# Patient Record
Sex: Female | Born: 1950
Health system: Southern US, Community
[De-identification: ages and names within clinical notes are randomized; demographics above are authoritative.]

## PROBLEM LIST (undated history)

## (undated) DIAGNOSIS — I639 Cerebral infarction, unspecified: Secondary | ICD-10-CM

## (undated) DIAGNOSIS — E559 Vitamin D deficiency, unspecified: Secondary | ICD-10-CM

## (undated) DIAGNOSIS — D649 Anemia, unspecified: Secondary | ICD-10-CM

## (undated) DIAGNOSIS — I4891 Unspecified atrial fibrillation: Secondary | ICD-10-CM

## (undated) DIAGNOSIS — M1990 Unspecified osteoarthritis, unspecified site: Secondary | ICD-10-CM

## (undated) DIAGNOSIS — H269 Unspecified cataract: Secondary | ICD-10-CM

## (undated) DIAGNOSIS — E785 Hyperlipidemia, unspecified: Secondary | ICD-10-CM

## (undated) DIAGNOSIS — T7840XA Allergy, unspecified, initial encounter: Secondary | ICD-10-CM

## (undated) HISTORY — DX: Cerebral infarction, unspecified: I63.9

## (undated) HISTORY — DX: Hyperlipidemia, unspecified: E78.5

## (undated) HISTORY — DX: Allergy, unspecified, initial encounter: T78.40XA

## (undated) HISTORY — DX: Unspecified osteoarthritis, unspecified site: M19.90

## (undated) HISTORY — PX: CHOLECYSTECTOMY: SHX55

## (undated) HISTORY — PX: ABDOMINAL HYSTERECTOMY: SHX81

## (undated) HISTORY — DX: Anemia, unspecified: D64.9

## (undated) HISTORY — DX: Unspecified cataract: H26.9

## (undated) HISTORY — PX: HERNIA REPAIR: SHX51

## (undated) HISTORY — DX: Unspecified atrial fibrillation: I48.91

---

## 1985-01-11 HISTORY — PX: GASTROPLASTY: SHX192

## 1985-01-11 HISTORY — PX: CHOLECYSTECTOMY: SHX55

## 2003-10-31 ENCOUNTER — Ambulatory Visit: Payer: Self-pay

## 2004-11-25 ENCOUNTER — Ambulatory Visit: Payer: Self-pay | Admitting: Family Medicine

## 2005-07-20 DIAGNOSIS — E7800 Pure hypercholesterolemia, unspecified: Secondary | ICD-10-CM | POA: Insufficient documentation

## 2005-12-01 ENCOUNTER — Ambulatory Visit: Payer: Self-pay | Admitting: Family Medicine

## 2006-02-08 DIAGNOSIS — D2360 Other benign neoplasm of skin of unspecified upper limb, including shoulder: Secondary | ICD-10-CM

## 2006-02-08 DIAGNOSIS — Z0000 Encounter for general adult medical examination without abnormal findings: Secondary | ICD-10-CM | POA: Insufficient documentation

## 2006-02-08 DIAGNOSIS — G43109 Migraine with aura, not intractable, without status migrainosus: Secondary | ICD-10-CM

## 2006-02-15 DIAGNOSIS — E785 Hyperlipidemia, unspecified: Secondary | ICD-10-CM

## 2006-04-15 ENCOUNTER — Ambulatory Visit: Payer: Self-pay | Admitting: General Surgery

## 2006-10-31 ENCOUNTER — Ambulatory Visit: Payer: Self-pay | Admitting: Family Medicine

## 2006-10-31 DIAGNOSIS — S63509A Unspecified sprain of unspecified wrist, initial encounter: Secondary | ICD-10-CM

## 2006-10-31 DIAGNOSIS — S8000XA Contusion of unspecified knee, initial encounter: Secondary | ICD-10-CM | POA: Insufficient documentation

## 2006-11-02 ENCOUNTER — Ambulatory Visit: Payer: Self-pay | Admitting: Family Medicine

## 2006-11-02 DIAGNOSIS — J069 Acute upper respiratory infection, unspecified: Secondary | ICD-10-CM | POA: Insufficient documentation

## 2007-02-09 ENCOUNTER — Ambulatory Visit: Payer: Self-pay | Admitting: Family Medicine

## 2007-02-15 ENCOUNTER — Ambulatory Visit: Payer: Self-pay | Admitting: Family Medicine

## 2007-03-09 DIAGNOSIS — L259 Unspecified contact dermatitis, unspecified cause: Secondary | ICD-10-CM

## 2007-03-09 DIAGNOSIS — R030 Elevated blood-pressure reading, without diagnosis of hypertension: Secondary | ICD-10-CM | POA: Insufficient documentation

## 2007-08-23 DIAGNOSIS — R109 Unspecified abdominal pain: Secondary | ICD-10-CM | POA: Insufficient documentation

## 2007-08-25 ENCOUNTER — Encounter: Payer: Self-pay | Admitting: Gastroenterology

## 2007-08-25 ENCOUNTER — Inpatient Hospital Stay: Payer: Self-pay | Admitting: Internal Medicine

## 2007-08-26 ENCOUNTER — Encounter: Payer: Self-pay | Admitting: Gastroenterology

## 2007-08-30 ENCOUNTER — Encounter: Payer: Self-pay | Admitting: Gastroenterology

## 2007-08-30 DIAGNOSIS — D379 Neoplasm of uncertain behavior of digestive organ, unspecified: Secondary | ICD-10-CM | POA: Insufficient documentation

## 2007-09-21 ENCOUNTER — Ambulatory Visit (HOSPITAL_COMMUNITY): Admission: RE | Admit: 2007-09-21 | Discharge: 2007-09-21 | Payer: Self-pay | Admitting: Gastroenterology

## 2007-09-21 ENCOUNTER — Encounter: Payer: Self-pay | Admitting: Gastroenterology

## 2007-09-21 ENCOUNTER — Ambulatory Visit: Payer: Self-pay | Admitting: Gastroenterology

## 2007-09-25 ENCOUNTER — Encounter: Payer: Self-pay | Admitting: Gastroenterology

## 2007-09-26 ENCOUNTER — Telehealth: Payer: Self-pay | Admitting: Gastroenterology

## 2008-01-23 DIAGNOSIS — K859 Acute pancreatitis without necrosis or infection, unspecified: Secondary | ICD-10-CM | POA: Insufficient documentation

## 2008-02-14 ENCOUNTER — Ambulatory Visit: Payer: Self-pay

## 2008-11-08 DIAGNOSIS — E668 Other obesity: Secondary | ICD-10-CM

## 2009-01-20 DIAGNOSIS — D649 Anemia, unspecified: Secondary | ICD-10-CM

## 2009-02-10 DIAGNOSIS — Z1239 Encounter for other screening for malignant neoplasm of breast: Secondary | ICD-10-CM | POA: Insufficient documentation

## 2009-02-10 DIAGNOSIS — Z803 Family history of malignant neoplasm of breast: Secondary | ICD-10-CM | POA: Insufficient documentation

## 2009-02-18 ENCOUNTER — Ambulatory Visit: Payer: Self-pay | Admitting: Family Medicine

## 2010-02-20 ENCOUNTER — Ambulatory Visit: Payer: Self-pay | Admitting: Family Medicine

## 2011-03-31 ENCOUNTER — Ambulatory Visit: Payer: Self-pay | Admitting: Family Medicine

## 2012-03-31 ENCOUNTER — Ambulatory Visit: Payer: Self-pay | Admitting: Family Medicine

## 2012-11-02 ENCOUNTER — Ambulatory Visit: Payer: Self-pay | Admitting: Family Medicine

## 2012-11-06 ENCOUNTER — Ambulatory Visit: Payer: Self-pay | Admitting: Family Medicine

## 2013-04-03 ENCOUNTER — Ambulatory Visit: Payer: Self-pay | Admitting: Family Medicine

## 2013-08-15 LAB — HEPATIC FUNCTION PANEL
ALT: 13 U/L (ref 7–35)
AST: 19 U/L (ref 13–35)

## 2014-03-18 LAB — CBC AND DIFFERENTIAL
HEMATOCRIT: 40 % (ref 36–46)
NEUTROS ABS: 53 /uL
WBC: 4.5 10^3/mL

## 2014-04-11 ENCOUNTER — Ambulatory Visit
Admit: 2014-04-11 | Disposition: A | Discharge status: Home / Self Care | Payer: Self-pay | Attending: Family Medicine | Admitting: Family Medicine

## 2014-04-13 ENCOUNTER — Other Ambulatory Visit: Payer: Self-pay

## 2014-04-13 DIAGNOSIS — K1230 Oral mucositis (ulcerative), unspecified: Secondary | ICD-10-CM | POA: Insufficient documentation

## 2014-04-13 DIAGNOSIS — IMO0001 Reserved for inherently not codable concepts without codable children: Secondary | ICD-10-CM | POA: Insufficient documentation

## 2014-04-13 DIAGNOSIS — A630 Anogenital (venereal) warts: Secondary | ICD-10-CM | POA: Insufficient documentation

## 2014-04-13 DIAGNOSIS — J209 Acute bronchitis, unspecified: Secondary | ICD-10-CM | POA: Insufficient documentation

## 2014-04-13 DIAGNOSIS — F411 Generalized anxiety disorder: Secondary | ICD-10-CM | POA: Insufficient documentation

## 2014-04-13 DIAGNOSIS — Z1331 Encounter for screening for depression: Secondary | ICD-10-CM | POA: Insufficient documentation

## 2014-04-13 DIAGNOSIS — H6120 Impacted cerumen, unspecified ear: Secondary | ICD-10-CM | POA: Insufficient documentation

## 2014-04-13 DIAGNOSIS — J40 Bronchitis, not specified as acute or chronic: Secondary | ICD-10-CM | POA: Insufficient documentation

## 2014-04-13 DIAGNOSIS — B019 Varicella without complication: Secondary | ICD-10-CM | POA: Insufficient documentation

## 2014-04-13 DIAGNOSIS — Z9229 Personal history of other drug therapy: Secondary | ICD-10-CM | POA: Insufficient documentation

## 2014-04-13 DIAGNOSIS — Z9884 Bariatric surgery status: Secondary | ICD-10-CM | POA: Insufficient documentation

## 2014-04-13 DIAGNOSIS — Z8673 Personal history of transient ischemic attack (TIA), and cerebral infarction without residual deficits: Secondary | ICD-10-CM | POA: Insufficient documentation

## 2014-04-13 DIAGNOSIS — M25539 Pain in unspecified wrist: Secondary | ICD-10-CM | POA: Insufficient documentation

## 2014-04-13 DIAGNOSIS — Z23 Encounter for immunization: Secondary | ICD-10-CM | POA: Insufficient documentation

## 2014-04-13 DIAGNOSIS — E559 Vitamin D deficiency, unspecified: Secondary | ICD-10-CM | POA: Insufficient documentation

## 2014-04-13 DIAGNOSIS — Z13820 Encounter for screening for osteoporosis: Secondary | ICD-10-CM | POA: Insufficient documentation

## 2014-06-17 ENCOUNTER — Ambulatory Visit (INDEPENDENT_AMBULATORY_CARE_PROVIDER_SITE_OTHER): Payer: PRIVATE HEALTH INSURANCE | Admitting: Family Medicine

## 2014-06-17 ENCOUNTER — Encounter: Payer: Self-pay | Admitting: Family Medicine

## 2014-06-17 VITALS — BP 124/84 | HR 62 | Temp 97.7°F | Resp 16 | Wt 290.8 lb

## 2014-06-17 DIAGNOSIS — E785 Hyperlipidemia, unspecified: Secondary | ICD-10-CM | POA: Diagnosis not present

## 2014-06-17 NOTE — Progress Notes (Signed)
Subjective:    Patient ID: Karen [name], female    DOB: [birth year], 64 y.o.   MRN: [id number]  HPI Chief Complaint  Patient presents with  . Follow-up    3 month follow up  . Hyperlipidemia - exercise by walking a mile every day. Still on low fat diet in small quantities due to past gastric bypass surgery (three meals a day). Still on Crestor 10 mg qd and no muscle pains.   Family History  Problem Relation Age of Onset  . Hypertension Mother   . Cancer Mother   . Arthritis Mother   . Depression Mother   . Heart disease Father   . Cancer Sister   . Cancer Brother   . Heart disease Maternal Grandmother   . Stroke Maternal Grandmother   . Cancer Paternal Grandfather   . Hyperlipidemia Brother    Past Medical History  Diagnosis Date  . Anemia   . Hyperlipidemia    History  Substance Use Topics  . Smoking status: Never Smoker   . Smokeless tobacco: Not on file  . Alcohol Use: 0.6 oz/week    1 Standard drinks or equivalent per week     Comment: rarely   Past Surgical History  Procedure Laterality Date  . Abdominal hysterectomy    . Hernia repair    . Cholecystectomy    . Cholecystectomy  1987  . Gastroplasty  1987   Allergies  Allergen Reactions  . Tetanus Toxoid     Multiple joint pains.     Current Outpatient Prescriptions on File Prior to Visit  Medication Sig Dispense Refill  . b complex vitamins capsule Take 2 tablets by mouth daily.    . cholecalciferol (VITAMIN D) 1000 UNITS tablet Take 1,000 Units by mouth every other day. 2000 units on all other days    . Cholecalciferol (VITAMIN D3) 2000 UNITS capsule Take 2,000 Units by mouth daily.    . MULTIPLE VITAMIN PO Take by mouth daily.    . rosuvastatin (CRESTOR) 10 MG tablet Take 1 tablet by mouth daily.    . SUMAtriptan (IMITREX) 50 MG tablet Take 1 tablet by mouth as needed.    . timolol (TIMOPTIC) 0.5 % ophthalmic solution Apply 1 drop to eye 2 (two) times daily.     No current facility-administered  medications on file prior to visit.     Review of Systems  Constitutional:       Exercising more in the warm weather. Obesity with very little change - lost 3 lbs since last OV  HENT: Negative.   Eyes: Negative.   Respiratory: Negative.   Cardiovascular: Negative.   Gastrointestinal: Negative.        Objective:   Physical Exam  Constitutional: She appears well-developed.  Obesity - morbid - s/p gastric bypass surgery  HENT:  Head: Normocephalic.  Right Ear: External ear normal.  Left Ear: External ear normal.  Eyes: EOM are normal. Pupils are equal, round, and reactive to light.  Neck: Normal range of motion. Neck supple.  Cardiovascular: Normal rate, regular rhythm and normal heart sounds.   Pulmonary/Chest: Breath sounds normal.  Abdominal: Soft. Bowel sounds are normal.   BP 124/84 mmHg  Pulse 62  Temp(Src) 97.7 F (36.5 C) (Oral)  Resp 16  Wt 290 lb 12.8 oz (131.906 kg)         Assessment & Plan:  1. Hyperlipidemia Stable and tolerating Crestor 10 mg qd without side effects. Lipids in March 2016 showed total  cholesterol 170, triglycerides 183, HDL 47 and LDL 86. Still need to lose significant weight. Continue present dosage of Crestor and recheck blood levels. Follow up pending reports.

## 2014-09-19 ENCOUNTER — Ambulatory Visit: Payer: PRIVATE HEALTH INSURANCE | Admitting: Family Medicine

## 2014-10-15 ENCOUNTER — Telehealth: Payer: Self-pay | Admitting: Family Medicine

## 2014-10-15 DIAGNOSIS — E782 Mixed hyperlipidemia: Secondary | ICD-10-CM

## 2014-10-15 NOTE — Telephone Encounter (Signed)
Patient states she will pick up lab slip on Thursday. Lab slip printed and put at front desk for pick up.

## 2014-10-15 NOTE — Telephone Encounter (Signed)
Pt called to get her lab order to have labs done .  She has an appt next week to see you.  Call back is [phone]  Thanks teri

## 2014-10-22 ENCOUNTER — Other Ambulatory Visit: Payer: Self-pay | Admitting: Family Medicine

## 2014-10-23 LAB — CBC WITH DIFFERENTIAL/PLATELET
BASOS ABS: 0 10*3/uL (ref 0.0–0.2)
Basos: 0 %
EOS (ABSOLUTE): 0.3 10*3/uL (ref 0.0–0.4)
EOS: 6 %
HEMATOCRIT: 38.8 % (ref 34.0–46.6)
HEMOGLOBIN: 12.3 g/dL (ref 11.1–15.9)
Immature Grans (Abs): 0 10*3/uL (ref 0.0–0.1)
Immature Granulocytes: 0 %
LYMPHS ABS: 1.7 10*3/uL (ref 0.7–3.1)
Lymphs: 38 %
MCH: 31.7 pg (ref 26.6–33.0)
MCHC: 31.7 g/dL (ref 31.5–35.7)
MCV: 100 fL — ABNORMAL HIGH (ref 79–97)
MONOCYTES: 11 %
Monocytes Absolute: 0.5 10*3/uL (ref 0.1–0.9)
NEUTROS ABS: 2 10*3/uL (ref 1.4–7.0)
Neutrophils: 45 %
Platelets: 230 10*3/uL (ref 150–379)
RBC: 3.88 x10E6/uL (ref 3.77–5.28)
RDW: 12.9 % (ref 12.3–15.4)
WBC: 4.5 10*3/uL (ref 3.4–10.8)

## 2014-10-23 LAB — COMPREHENSIVE METABOLIC PANEL
ALT: 12 IU/L (ref 0–32)
AST: 20 IU/L (ref 0–40)
Albumin/Globulin Ratio: 1.3 (ref 1.1–2.5)
Albumin: 3.6 g/dL (ref 3.6–4.8)
Alkaline Phosphatase: 54 IU/L (ref 39–117)
BUN/Creatinine Ratio: 18 (ref 11–26)
BUN: 16 mg/dL (ref 8–27)
Bilirubin Total: 0.7 mg/dL (ref 0.0–1.2)
CALCIUM: 9.4 mg/dL (ref 8.7–10.3)
CO2: 25 mmol/L (ref 18–29)
CREATININE: 0.87 mg/dL (ref 0.57–1.00)
Chloride: 102 mmol/L (ref 97–108)
GFR, EST AFRICAN AMERICAN: 83 mL/min/{1.73_m2} (ref >59)
GFR, EST NON AFRICAN AMERICAN: 72 mL/min/{1.73_m2} (ref >59)
Globulin, Total: 2.7 g/dL (ref 1.5–4.5)
Glucose: 98 mg/dL (ref 65–99)
Potassium: 4.7 mmol/L (ref 3.5–5.2)
Sodium: 141 mmol/L (ref 134–144)
TOTAL PROTEIN: 6.3 g/dL (ref 6.0–8.5)

## 2014-10-23 LAB — LIPID PANEL
CHOL/HDL RATIO: 4 ratio (ref 0.0–4.4)
Cholesterol, Total: 166 mg/dL (ref 100–199)
HDL: 41 mg/dL (ref >39)
LDL CALC: 90 mg/dL (ref 0–99)
Triglycerides: 174 mg/dL — ABNORMAL HIGH (ref 0–149)
VLDL Cholesterol Cal: 35 mg/dL (ref 5–40)

## 2014-10-24 ENCOUNTER — Ambulatory Visit (INDEPENDENT_AMBULATORY_CARE_PROVIDER_SITE_OTHER): Payer: PRIVATE HEALTH INSURANCE | Admitting: Family Medicine

## 2014-10-24 ENCOUNTER — Encounter: Payer: Self-pay | Admitting: Family Medicine

## 2014-10-24 VITALS — BP 148/82 | HR 62 | Temp 98.1°F | Resp 16 | Wt 292.8 lb

## 2014-10-24 DIAGNOSIS — E785 Hyperlipidemia, unspecified: Secondary | ICD-10-CM | POA: Diagnosis not present

## 2014-10-24 DIAGNOSIS — Z23 Encounter for immunization: Secondary | ICD-10-CM | POA: Diagnosis not present

## 2014-10-24 DIAGNOSIS — J309 Allergic rhinitis, unspecified: Secondary | ICD-10-CM | POA: Diagnosis not present

## 2014-10-24 NOTE — Progress Notes (Signed)
Patient: Karen [name] Female    DOB: 30-May-1950   64 y.o.   MRN: [id number] Visit Date: 10/24/2014  Today's Provider: Vernie Murders, PA   Chief Complaint  Patient presents with  . Hyperlipidemia   Subjective:    Hyperlipidemia Chronicity: following up on Hyperlipidemia. The current episode started more than 1 year ago. Pertinent negatives include no chest pain. Current antihyperlipidemic treatment includes exercise and diet change (Low fat diet.). Improvement on treatment: on Crestor.  Labs obtained 10/22/14. Past Medical History  Diagnosis Date  . Anemia   . Hyperlipidemia    Past Surgical History  Procedure Laterality Date  . Abdominal hysterectomy    . Hernia repair    . Cholecystectomy    . Cholecystectomy  1987  . Gastroplasty  1987   Family History  Problem Relation Age of Onset  . Hypertension Mother   . Cancer Mother   . Arthritis Mother   . Depression Mother   . Heart disease Father   . Cancer Sister   . Cancer Brother   . Heart disease Maternal Grandmother   . Stroke Maternal Grandmother   . Cancer Paternal Grandfather   . Hyperlipidemia Brother    Lab Results  Component Value Date   WBC 4.5 10/22/2014   HCT 38.8 10/22/2014   GLUCOSE 98 10/22/2014   CHOL 166 10/22/2014   TRIG 174* 10/22/2014   HDL 41 10/22/2014   LDLCALC 90 10/22/2014   ALT 12 10/22/2014   AST 20 10/22/2014   NA 141 10/22/2014   K 4.7 10/22/2014   CL 102 10/22/2014   CREATININE 0.87 10/22/2014   BUN 16 10/22/2014   CO2 25 10/22/2014   Allergies  Allergen Reactions  . Tetanus Toxoid     Multiple joint pains.   Previous Medications   B COMPLEX VITAMINS CAPSULE    Take 2 tablets by mouth daily.   CALCIUM CARBONATE (CALCIUM 600 PO)    Take by mouth daily.   CHOLECALCIFEROL (VITAMIN D) 1000 UNITS TABLET    Take 1,000 Units by mouth every other day. 2000 units on all other days   MULTIPLE VITAMIN PO    Take by mouth daily.   ROSUVASTATIN (CRESTOR) 10 MG TABLET     Take 1 tablet by mouth daily.   SUMATRIPTAN (IMITREX) 50 MG TABLET    Take 1 tablet by mouth as needed.   TIMOLOL (TIMOPTIC) 0.5 % OPHTHALMIC SOLUTION    Apply 1 drop to eye 2 (two) times daily.   Review of Systems  HENT: Positive for rhinorrhea and sneezing. Negative for sinus pressure.   Respiratory: Negative for cough, chest tightness and wheezing.   Cardiovascular: Negative for chest pain, palpitations and leg swelling.  Neurological: Positive for headaches. Negative for dizziness.   Social History  Substance Use Topics  . Smoking status: Never Smoker   . Smokeless tobacco: Not on file  . Alcohol Use: 0.6 oz/week    1 Standard drinks or equivalent per week     Comment: rarely   Objective:   BP 148/82 mmHg  Pulse 62  Temp(Src) 98.1 F (36.7 C) (Oral)  Resp 16  Wt 292 lb 12.8 oz (132.813 kg)  Physical Exam  Constitutional: She is oriented to person, place, and time. She appears well-developed and well-nourished.  Morbid obesity.  HENT:  Head: Normocephalic and atraumatic.  Right Ear: External ear normal.  Left Ear: External ear normal.  Nose: Nose normal.  Eyes: Conjunctivae and  EOM are normal.  Neck: Neck supple.  Cardiovascular: Normal rate, regular rhythm and normal heart sounds.   Pulmonary/Chest: Effort normal and breath sounds normal.  Abdominal: Soft. Bowel sounds are normal.  Musculoskeletal: Normal range of motion.  Neurological: She is alert and oriented to person, place, and time.  Psychiatric: She has a normal mood and affect. Her behavior is normal.      Assessment & Plan:     1. HLD (hyperlipidemia) Tolerating Crestor without side effects. Recent labs show good control except triglycerides still elevated. Continue present dosage and must work on weight loss again. Recheck progress in 3-4 months.  2. Need for influenza vaccination - Flu Vaccine QUAD 36+ mos IM  3. Allergic rhinitis, unspecified allergic rhinitis type Suspect seasonal allergies  (ragweed). May use Claritin or Allegra prn.       Vernie Murders, PA   [facility]

## 2015-02-05 ENCOUNTER — Other Ambulatory Visit: Payer: Self-pay | Admitting: Family Medicine

## 2015-02-05 DIAGNOSIS — E785 Hyperlipidemia, unspecified: Secondary | ICD-10-CM

## 2015-02-25 ENCOUNTER — Ambulatory Visit (INDEPENDENT_AMBULATORY_CARE_PROVIDER_SITE_OTHER): Payer: PRIVATE HEALTH INSURANCE | Admitting: Family Medicine

## 2015-02-25 ENCOUNTER — Encounter: Payer: Self-pay | Admitting: Family Medicine

## 2015-02-25 VITALS — BP 142/98 | HR 74 | Temp 98.2°F | Resp 16 | Wt 296.2 lb

## 2015-02-25 DIAGNOSIS — B354 Tinea corporis: Secondary | ICD-10-CM | POA: Diagnosis not present

## 2015-02-25 MED ORDER — ECONAZOLE NITRATE 1 % EX CREA: TOPICAL_CREAM | Freq: Two times a day (BID) | CUTANEOUS | Status: DC

## 2015-02-25 NOTE — Patient Instructions (Signed)

## 2015-02-25 NOTE — Progress Notes (Signed)
Patient ID: ADALIZ LARAIA, female   DOB: 1950-09-27, 65 y.o.   MRN: [id number]   Patient: Karen [name] Female    DOB: 11-04-1950   65 y.o.   MRN: [id number] Visit Date: 02/25/2015  Today's Provider: Vernie Murders, PA   Chief Complaint  Patient presents with  . Skin Problem   Subjective:    HPI Patient presents with five round shaped circles on right thigh area X 1 month. Patient denies itching and pain. Patient reports when she is driving and pressing the pedal she has a "different" sensation in the right thigh.  Past Medical History  Diagnosis Date  . Anemia   . Hyperlipidemia    Past Surgical History  Procedure Laterality Date  . Abdominal hysterectomy    . Hernia repair    . Cholecystectomy    . Cholecystectomy  1987  . Gastroplasty  1987   Family History  Problem Relation Age of Onset  . Hypertension Mother   . Cancer Mother   . Arthritis Mother   . Depression Mother   . Heart disease Father   . Cancer Sister   . Cancer Brother   . Heart disease Maternal Grandmother   . Stroke Maternal Grandmother   . Cancer Paternal Grandfather   . Hyperlipidemia Brother      Previous Medications   B COMPLEX VITAMINS CAPSULE    Take 2 tablets by mouth daily.   CALCIUM CARBONATE (CALCIUM 600 PO)    Take by mouth daily.   CHOLECALCIFEROL (VITAMIN D) 1000 UNITS TABLET    Take 1,000 Units by mouth every other day. 2000 units on all other days   MULTIPLE VITAMIN PO    Take by mouth daily.   ROSUVASTATIN (CRESTOR) 10 MG TABLET    TAKE 1 TABLET BY MOUTH DAILY   SUMATRIPTAN (IMITREX) 50 MG TABLET    Take 1 tablet by mouth as needed.   TIMOLOL (TIMOPTIC) 0.5 % OPHTHALMIC SOLUTION    Apply 1 drop to eye 2 (two) times daily.   Allergies  Allergen Reactions  . Tetanus Toxoid     Multiple joint pains.    Review of Systems  Constitutional: Negative.   HENT: Negative.   Eyes: Negative.   Respiratory: Negative.   Cardiovascular: Negative.   Gastrointestinal: Negative.   Endocrine:  Negative.   Genitourinary: Negative.   Musculoskeletal: Negative.   Skin:       Skin changes   Allergic/Immunologic: Negative.   Neurological: Negative.   Hematological: Negative.   Psychiatric/Behavioral: Negative.     Social History  Substance Use Topics  . Smoking status: Never Smoker   . Smokeless tobacco: Not on file  . Alcohol Use: 0.6 oz/week    1 Standard drinks or equivalent per week     Comment: rarely   Objective:   BP 142/98 mmHg  Pulse 74  Temp(Src) 98.2 F (36.8 C) (Oral)  Resp 16  Wt 296 lb 3.2 oz (134.355 kg)  SpO2 96%  Physical Exam  Constitutional: She appears well-developed and well-nourished.  Morbid obesity.  Skin: Rash noted.  2-3 circular lesions on both lateral thighs. Each lesion 1.5 cm in diameter, erythematous, raised edge with fine central scale. Minimal itch.      Assessment & Plan:     1. Tinea corporis Onset over the past month without worsening or spread of lesions. Will treat with Econazole BID for at least 2 weeks. Recheck prn. - econazole nitrate 1 % cream; Apply topically 2 (two)  times daily.  Dispense: 15 g; Refill: 1

## 2015-02-27 ENCOUNTER — Ambulatory Visit: Payer: PRIVATE HEALTH INSURANCE | Admitting: Family Medicine

## 2015-03-20 ENCOUNTER — Encounter: Payer: Self-pay | Admitting: Family Medicine

## 2015-03-20 ENCOUNTER — Ambulatory Visit (INDEPENDENT_AMBULATORY_CARE_PROVIDER_SITE_OTHER): Payer: 59 | Admitting: Family Medicine

## 2015-03-20 VITALS — BP 138/84 | HR 62 | Temp 98.1°F | Resp 16 | Ht 62.5 in | Wt 292.8 lb

## 2015-03-20 DIAGNOSIS — Z9884 Bariatric surgery status: Secondary | ICD-10-CM

## 2015-03-20 DIAGNOSIS — Z23 Encounter for immunization: Secondary | ICD-10-CM | POA: Diagnosis not present

## 2015-03-20 DIAGNOSIS — Z1239 Encounter for other screening for malignant neoplasm of breast: Secondary | ICD-10-CM

## 2015-03-20 DIAGNOSIS — Z0000 Encounter for general adult medical examination without abnormal findings: Secondary | ICD-10-CM | POA: Diagnosis not present

## 2015-03-20 DIAGNOSIS — Z13820 Encounter for screening for osteoporosis: Secondary | ICD-10-CM | POA: Diagnosis not present

## 2015-03-20 DIAGNOSIS — E785 Hyperlipidemia, unspecified: Secondary | ICD-10-CM

## 2015-03-20 LAB — POCT URINALYSIS DIPSTICK
Bilirubin, UA: NEGATIVE
Glucose, UA: NEGATIVE
Ketones, UA: NEGATIVE
Leukocytes, UA: NEGATIVE
NITRITE UA: NEGATIVE
PH UA: 5
RBC UA: NEGATIVE
Spec Grav, UA: >=1.03
UROBILINOGEN UA: 0.2

## 2015-03-20 NOTE — Progress Notes (Signed)
**Note Karen-Identified via Obfuscation** Patient ID: Karen [name], female   DOB: April 06, 1950, 65 y.o.   MRN: [id number]       Patient: Karen [name], Female    DOB: [birth year], 65 y.o.   MRN: [id number] Visit Date: 03/20/2015  Today's Provider: Vernie Murders, PA   Chief Complaint  Patient presents with  . Annual Exam   Subjective:    Annual physical exam Karen [name] is a 65 y.o. female who presents today for health maintenance and complete physical. She feels well. She reports exercising 4 times per week. She reports she is sleeping average 6 hours per night.  ----------------------------------------------------------------- Colonoscopy: 2008 Mammo: 04/11/2014 BMD: 03/26/2013  Review of Systems  Constitutional: Negative.   HENT: Negative.   Eyes: Negative.   Respiratory: Negative.   Cardiovascular: Negative.   Gastrointestinal: Negative.   Endocrine: Negative.   Genitourinary: Negative.   Musculoskeletal: Negative.   Skin: Negative.   Allergic/Immunologic: Negative.   Neurological: Negative.   Hematological: Negative.   Psychiatric/Behavioral: Negative.     Social History      She  reports that she has never smoked. She does not have any smokeless tobacco history on file. She reports that she drinks about 0.6 oz of alcohol per week. She reports that she does not use illicit drugs.       Social History   Social History  . Marital Status: Married    Spouse Name: N/A  . Number of Children: N/A  . Years of Education: N/A   Social History Main Topics  . Smoking status: Never Smoker   . Smokeless tobacco: None  . Alcohol Use: 0.6 oz/week    1 Standard drinks or equivalent per week     Comment: rarely  . Drug Use: No  . Sexual Activity: Not Asked   Other Topics Concern  . None   Social History Narrative    Past Medical History  Diagnosis Date  . Anemia   . Hyperlipidemia      Patient Active Problem List   Diagnosis Date Noted  . Anxiety state 04/13/2014  . Acute bronchitis 04/13/2014  .  Bronchitis 04/13/2014  . Cerebrovascular accident, old 04/13/2014  . Chicken pox 04/13/2014  . Condyloma acuminatum 04/13/2014  . Screening for depression 04/13/2014  . Bariatric surgery status 04/13/2014  . Cerumen impaction 04/13/2014  . Received influenza vaccination at hospital 04/13/2014  . Screening for osteoporosis 04/13/2014  . Flu vaccine need 04/13/2014  . Pain in the wrist 04/13/2014  . Mucositis oral 04/13/2014  . Avitaminosis D 04/13/2014  . Gravida 0 04/13/2014  . Screening breast examination 02/10/2009  . Family history of breast cancer 02/10/2009  . Absolute anemia 01/20/2009  . Extreme obesity ([place]) 11/08/2008  . Acute inflammation of the pancreas 01/23/2008  . NEOPLASM UNCERTAIN BHV OTH&UNSPEC DIGESTIVE ORGN 08/30/2007  . Abdominal pain 08/23/2007  . CD (contact dermatitis) 03/09/2007  . Blood pressure elevated without history of HTN 03/09/2007  . Infection of the upper respiratory tract 11/02/2006  . Contusion of knee 10/31/2006  . Sprain of wrist 10/31/2006  . HLD (hyperlipidemia) 02/15/2006  . Routine general medical examination at a health care facility 02/08/2006  . Acute onset aura migraine 02/08/2006  . Benign neoplasm of skin of upper limb, including shoulder 02/08/2006  . Hypercholesterolemia without hypertriglyceridemia 07/20/2005    Past Surgical History  Procedure Laterality Date  . Abdominal hysterectomy    . Hernia repair    . Cholecystectomy    . Cholecystectomy  1987  .  Gastroplasty  1987    Family History        Family Status  Relation Status Death Age  . Mother Deceased   . Father Deceased   . Sister Deceased   . Brother Deceased   . Paternal Grandfather Deceased   . Brother Alive         Her family history includes Arthritis in her mother; Cancer in her brother, mother, paternal grandfather, and sister; Depression in her mother; Heart disease in her father and maternal grandmother; Hyperlipidemia in her brother; Hypertension in  her mother; Stroke in her maternal grandmother.    Allergies  Allergen Reactions  . Tetanus Toxoid     Multiple joint pains.    Previous Medications   B COMPLEX VITAMINS CAPSULE    Take 2 tablets by mouth daily.   CALCIUM CARBONATE (CALCIUM 600 PO)    Take by mouth daily.   CHOLECALCIFEROL (VITAMIN D) 1000 UNITS TABLET    Take 1,000 Units by mouth every other day. 2000 units on all other days   ECONAZOLE NITRATE 1 % CREAM    Apply topically 2 (two) times daily.   MULTIPLE VITAMIN PO    Take by mouth daily.   ROSUVASTATIN (CRESTOR) 10 MG TABLET    TAKE 1 TABLET BY MOUTH DAILY   SUMATRIPTAN (IMITREX) 50 MG TABLET    Take 1 tablet by mouth as needed.   TIMOLOL (TIMOPTIC) 0.5 % OPHTHALMIC SOLUTION    Apply 1 drop to eye 2 (two) times daily.    Patient Care Team: Margo Common, PA as PCP - General (Physician Assistant)     Objective:   Vitals: BP 138/84 mmHg  Pulse 62  Temp(Src) 98.1 F (36.7 C) (Oral)  Resp 16  Ht 5' 2.5" (1.588 m)  Wt 292 lb 12.8 oz (132.813 kg)  BMI 52.67 kg/m2   Physical Exam   Depression Screen PHQ 2/9 Scores 03/20/2015  PHQ - 2 Score 0   Assessment & Plan:     Routine Health Maintenance and Physical Exam  Exercise Activities and Dietary recommendations Goals    Walk a mile 4 times a week. Small meals three times a day since bariatric surgery in 1987.      Immunization History  Administered Date(s) Administered  . Influenza,inj,Quad PF,36+ Mos 10/24/2014  . Influenza-Unspecified 11/08/2013    Health Maintenance  Topic Date Due  . Hepatitis C Screening  1950-02-28  . HIV Screening  06/20/1967  . PAP SMEAR  06/19/1973  . ZOSTAVAX  06/19/2012  . INFLUENZA VACCINE  08/12/2015  . COLONOSCOPY  03/17/2016  . MAMMOGRAM  04/10/2016  . TETANUS/TDAP  03/19/2025      Discussed health benefits of physical activity, and encouraged her to engage in regular exercise appropriate for her age and condition.      --------------------------------------------------------------------  1. Annual physical exam General health stable with severe obesity despite gastric banding in 1987. Will check labs for metabolic disorders, thyroid disease and diabetes. Tetanus booster caused an allergic reaction in the past (joint pains). History of partial hysterectomy  - POCT urinalysis dipstick  2. Osteoporosis screening Continues to take Vitamin-D and Calcium supplements. Recheck BMD. Continues to try to walk a mile 4 days a week. - DG Bone Density; Future  3. HLD (hyperlipidemia) Tolerating Crestor without muscle pains. Encouraged to continue low fat diet and exercise with medication. - Lipid panel - TSH - Hemoglobin A1c  4. Bariatric surgery status Accomplished in 1987. Initially lost 140 lbs  but has regained 40 lbs. Will check labs with Hgb A1C. - Comprehensive metabolic panel - CBC with Differential/Platelet  5. Screening breast examination Family history positive for breast cancer in a sister. No masses in this patient. - MM Digital Screening  6. Need for zoster vaccination - Varicella-zoster vaccine subcutaneous

## 2015-03-21 ENCOUNTER — Telehealth: Payer: Self-pay

## 2015-03-21 LAB — COMPREHENSIVE METABOLIC PANEL
A/G RATIO: 1.4 (ref 1.1–2.5)
ALK PHOS: 58 IU/L (ref 39–117)
ALT: 17 IU/L (ref 0–32)
AST: 29 IU/L (ref 0–40)
Albumin: 3.8 g/dL (ref 3.6–4.8)
BUN/Creatinine Ratio: 22 (ref 11–26)
BUN: 17 mg/dL (ref 8–27)
Bilirubin Total: 0.7 mg/dL (ref 0.0–1.2)
CO2: 28 mmol/L (ref 18–29)
Calcium: 10.2 mg/dL (ref 8.7–10.3)
Chloride: 101 mmol/L (ref 96–106)
Creatinine, Ser: 0.79 mg/dL (ref 0.57–1.00)
GFR calc Af Amer: 93 mL/min/{1.73_m2} (ref >59)
GFR calc non Af Amer: 80 mL/min/{1.73_m2} (ref >59)
GLOBULIN, TOTAL: 2.8 g/dL (ref 1.5–4.5)
Glucose: 103 mg/dL — ABNORMAL HIGH (ref 65–99)
POTASSIUM: 4.1 mmol/L (ref 3.5–5.2)
SODIUM: 142 mmol/L (ref 134–144)
Total Protein: 6.6 g/dL (ref 6.0–8.5)

## 2015-03-21 LAB — CBC WITH DIFFERENTIAL/PLATELET
BASOS ABS: 0 10*3/uL (ref 0.0–0.2)
Basos: 0 %
EOS (ABSOLUTE): 0.3 10*3/uL (ref 0.0–0.4)
Eos: 4 %
Hematocrit: 39.5 % (ref 34.0–46.6)
Hemoglobin: 12.8 g/dL (ref 11.1–15.9)
IMMATURE GRANS (ABS): 0 10*3/uL (ref 0.0–0.1)
IMMATURE GRANULOCYTES: 0 %
LYMPHS: 33 %
Lymphocytes Absolute: 1.9 10*3/uL (ref 0.7–3.1)
MCH: 32.7 pg (ref 26.6–33.0)
MCHC: 32.4 g/dL (ref 31.5–35.7)
MCV: 101 fL — ABNORMAL HIGH (ref 79–97)
MONOS ABS: 0.5 10*3/uL (ref 0.1–0.9)
Monocytes: 9 %
NEUTROS PCT: 54 %
Neutrophils Absolute: 3.1 10*3/uL (ref 1.4–7.0)
PLATELETS: 247 10*3/uL (ref 150–379)
RBC: 3.92 x10E6/uL (ref 3.77–5.28)
RDW: 12.6 % (ref 12.3–15.4)
WBC: 5.7 10*3/uL (ref 3.4–10.8)

## 2015-03-21 LAB — LIPID PANEL
CHOLESTEROL TOTAL: 179 mg/dL (ref 100–199)
Chol/HDL Ratio: 3.5 ratio units (ref 0.0–4.4)
HDL: 51 mg/dL (ref >39)
LDL Calculated: 100 mg/dL — ABNORMAL HIGH (ref 0–99)
TRIGLYCERIDES: 139 mg/dL (ref 0–149)
VLDL Cholesterol Cal: 28 mg/dL (ref 5–40)

## 2015-03-21 LAB — HEMOGLOBIN A1C
Est. average glucose Bld gHb Est-mCnc: 103 mg/dL
Hgb A1c MFr Bld: 5.2 % (ref 4.8–5.6)

## 2015-03-21 LAB — TSH: TSH: 1.57 u[IU]/mL (ref 0.450–4.500)

## 2015-03-21 NOTE — Telephone Encounter (Signed)
-----   Message from Margo Common, [place] sent at 03/21/2015  3:56 PM EST ----- All blood tests in good shape. Continue efforts to exercise and lose some weight. Cholesterol is very good. Continue present medications and recheck in 4-6 months.

## 2015-03-21 NOTE — Telephone Encounter (Signed)
Patient advised as directed below. Patient verbalized understanding and agreed with plan of care.

## 2015-03-25 ENCOUNTER — Telehealth: Payer: Self-pay

## 2015-03-25 NOTE — Telephone Encounter (Signed)
-----   Message from Margo Common, [place] sent at 03/24/2015  5:07 PM EDT ----- Negative hepatitis C test.

## 2015-03-25 NOTE — Telephone Encounter (Signed)
LMTCB

## 2015-03-26 NOTE — Telephone Encounter (Signed)
Pt returned your call ° °Teri °

## 2015-03-27 ENCOUNTER — Telehealth: Payer: Self-pay

## 2015-03-27 MED ORDER — ATORVASTATIN CALCIUM 20 MG PO TABS: 20.0000 mg | ORAL_TABLET | Freq: Every day | ORAL | Status: DC

## 2015-03-27 NOTE — Telephone Encounter (Signed)
Patient advised as directed below. 

## 2015-03-27 NOTE — Telephone Encounter (Signed)
Pt is returning call.  CB#[phone]/MW °

## 2015-03-27 NOTE — Telephone Encounter (Signed)
Patient states with new insurance Crestor will cost $35.00 a month when she was only paying $10.00.  Pharmacist told patient that Lipitor is a tier 1 which would only cost $10.00. Patient wanted to know if she could switch to Lipitor? Please advise  Pharmacy- [facility] in [place]  Patient will be available on home number until 12 pm and work number [phone] after 2 pm.

## 2015-03-27 NOTE — Telephone Encounter (Signed)
May try switch to Lipitor (atorvastatin) 20 mg qd #30 & 3RF. Recheck lipid panel in 3 months to check progress.

## 2015-03-27 NOTE — Telephone Encounter (Signed)
Patient advised RX has been sent to pharmacy  

## 2015-03-28 LAB — HEPATITIS C ANTIBODY

## 2015-03-28 LAB — SPECIMEN STATUS REPORT

## 2015-04-16 ENCOUNTER — Ambulatory Visit
Admission: RE | Admit: 2015-04-16 | Discharge: 2015-04-16 | Disposition: A | Discharge status: Home / Self Care | Payer: 59 | Source: Ambulatory Visit | Attending: Family Medicine | Admitting: Family Medicine

## 2015-04-16 ENCOUNTER — Telehealth: Payer: Self-pay | Admitting: Family Medicine

## 2015-04-16 ENCOUNTER — Telehealth: Payer: Self-pay

## 2015-04-16 DIAGNOSIS — Z1231 Encounter for screening mammogram for malignant neoplasm of breast: Secondary | ICD-10-CM | POA: Diagnosis not present

## 2015-04-16 DIAGNOSIS — Z13820 Encounter for screening for osteoporosis: Secondary | ICD-10-CM | POA: Insufficient documentation

## 2015-04-16 DIAGNOSIS — M8580 Other specified disorders of bone density and structure, unspecified site: Secondary | ICD-10-CM | POA: Insufficient documentation

## 2015-04-16 NOTE — Telephone Encounter (Signed)
Bone density test shows some osteopenia but no osteoporosis yet ("thinning, not bone loss"). Recommend at least 1200 mg calcium daily and at least 800 IU vitamin-D daily to prevent progression. Recheck bone density test in 2 years.

## 2015-04-16 NOTE — Telephone Encounter (Signed)
Patient advised as directed below. Patient verbalized understanding.  

## 2015-04-16 NOTE — Telephone Encounter (Signed)
-----   Message from Margo Common, [place] sent at 04/16/2015  3:33 PM EDT ----- No sign of malignancy on mammograms. Repeat test in 1 year as recommended by radiologist.

## 2015-04-16 NOTE — Telephone Encounter (Signed)
Patient advised as directed below. Patient verbalized understanding and agrees with plan of care.  

## 2015-08-27 ENCOUNTER — Other Ambulatory Visit: Payer: Self-pay | Admitting: Family Medicine

## 2015-08-27 NOTE — Telephone Encounter (Signed)
This is a Recruitment consultant pt. CPE with him on 03/20/2015. Renaldo Fiddler, CMA

## 2016-02-05 ENCOUNTER — Telehealth: Payer: Self-pay

## 2016-02-05 ENCOUNTER — Emergency Department
Admission: EM | Admit: 2016-02-05 | Discharge: 2016-02-05 | Disposition: A | Discharge status: Home / Self Care | Payer: 59 | Attending: Emergency Medicine | Admitting: Emergency Medicine

## 2016-02-05 ENCOUNTER — Encounter: Payer: Self-pay | Admitting: Emergency Medicine

## 2016-02-05 DIAGNOSIS — R1031 Right lower quadrant pain: Secondary | ICD-10-CM | POA: Insufficient documentation

## 2016-02-05 DIAGNOSIS — R319 Hematuria, unspecified: Secondary | ICD-10-CM | POA: Insufficient documentation

## 2016-02-05 DIAGNOSIS — Z79899 Other long term (current) drug therapy: Secondary | ICD-10-CM | POA: Insufficient documentation

## 2016-02-05 DIAGNOSIS — R109 Unspecified abdominal pain: Secondary | ICD-10-CM

## 2016-02-05 HISTORY — DX: Vitamin D deficiency, unspecified: E55.9

## 2016-02-05 LAB — URINALYSIS, COMPLETE (UACMP) WITH MICROSCOPIC
Bilirubin Urine: NEGATIVE
Glucose, UA: NEGATIVE mg/dL
Ketones, ur: 20 mg/dL — AB
Leukocytes, UA: NEGATIVE
Nitrite: NEGATIVE
PROTEIN: 100 mg/dL — AB
SPECIFIC GRAVITY, URINE: 1.03 (ref 1.005–1.030)
pH: 5 (ref 5.0–8.0)

## 2016-02-05 LAB — CBC
HEMATOCRIT: 35.9 % (ref 35.0–47.0)
HEMOGLOBIN: 12.5 g/dL (ref 12.0–16.0)
MCH: 33.8 pg (ref 26.0–34.0)
MCHC: 34.7 g/dL (ref 32.0–36.0)
MCV: 97.4 fL (ref 80.0–100.0)
Platelets: 197 10*3/uL (ref 150–440)
RBC: 3.68 MIL/uL — ABNORMAL LOW (ref 3.80–5.20)
RDW: 12.8 % (ref 11.5–14.5)
WBC: 5.6 10*3/uL (ref 3.6–11.0)

## 2016-02-05 LAB — COMPREHENSIVE METABOLIC PANEL
ALBUMIN: 3.7 g/dL (ref 3.5–5.0)
ALK PHOS: 53 U/L (ref 38–126)
ALT: 15 U/L (ref 14–54)
ANION GAP: 8 (ref 5–15)
AST: 23 U/L (ref 15–41)
BUN: 23 mg/dL — AB (ref 6–20)
CO2: 25 mmol/L (ref 22–32)
Calcium: 9.2 mg/dL (ref 8.9–10.3)
Chloride: 106 mmol/L (ref 101–111)
Creatinine, Ser: 1.05 mg/dL — ABNORMAL HIGH (ref 0.44–1.00)
GFR calc Af Amer: >60 mL/min (ref >60)
GFR calc non Af Amer: 55 mL/min — ABNORMAL LOW (ref >60)
GLUCOSE: 126 mg/dL — AB (ref 65–99)
POTASSIUM: 4.1 mmol/L (ref 3.5–5.1)
SODIUM: 139 mmol/L (ref 135–145)
Total Bilirubin: 0.9 mg/dL (ref 0.3–1.2)
Total Protein: 7.1 g/dL (ref 6.5–8.1)

## 2016-02-05 LAB — LIPASE, BLOOD: Lipase: 22 U/L (ref 11–51)

## 2016-02-05 MED ORDER — HYDROCODONE-ACETAMINOPHEN 5-325 MG PO TABS: 1.0000 | ORAL_TABLET | ORAL | 0 refills | Status: DC | PRN

## 2016-02-05 MED ORDER — ONDANSETRON 4 MG PO TBDP: ORAL_TABLET | ORAL | 0 refills | Status: DC

## 2016-02-05 MED ORDER — DOCUSATE SODIUM 100 MG PO CAPS: ORAL_CAPSULE | ORAL | 0 refills | Status: DC

## 2016-02-05 NOTE — Telephone Encounter (Signed)
Patient is calling with abdominal pain in the RLQ radiates to her Lower back. She reports the pain started this morning at 10:30 and it has progress with vomiting and some loose stool. Patient sounds in a lot of pain,shivering and reports that her pain is dull and  becomes sharp as it moves to the lower right abdomen.  Pain is a 10/10. Advised patient to go the ED to rule out Appendicitis per Dr. Rosanna Randy. Per patient she is not able to drive bu is going to call her husband. Advised patient if she got worse while she waited for her husband to call EMS.  Thanks,  -Joseline

## 2016-02-05 NOTE — ED Provider Notes (Signed)
[facility] Emergency Department Provider Note  ____________________________________________   First MD Initiated Contact with Patient 02/05/16 2039     (approximate)  I have reviewed the triage vital signs and the nursing notes.   HISTORY  Chief Complaint Abdominal Pain    HPI Karen [name] is a 66 y.o. female who presents with acute onset severe sharp RLQ pain radiating to right flank w/ multiple episodes of vomiting.  Denies fever, CP, SOB, diarrhea.  Some recent constipation.  Denies dysuria and hematuria.  No similar episodes in the past.  Hx of cholecystectomy, no appendectomy.  Movement made pain worse, nothing made it better, but while awaiting evaulation the pain has complete resolved.  She is now hungry and thirsty and wants to go eat dinner.     Past Medical History:  Diagnosis Date  . Anemia   . Hyperlipidemia   . Vitamin D deficiency     Patient Active Problem List   Diagnosis Date Noted  . Anxiety state 04/13/2014  . Acute bronchitis 04/13/2014  . Bronchitis 04/13/2014  . Cerebrovascular accident, old 04/13/2014  . Chicken pox 04/13/2014  . Condyloma acuminatum 04/13/2014  . Screening for depression 04/13/2014  . Bariatric surgery status 04/13/2014  . Cerumen impaction 04/13/2014  . Received influenza vaccination at hospital 04/13/2014  . Screening for osteoporosis 04/13/2014  . Flu vaccine need 04/13/2014  . Pain in the wrist 04/13/2014  . Mucositis oral 04/13/2014  . Avitaminosis D 04/13/2014  . Gravida 0 04/13/2014  . Screening breast examination 02/10/2009  . Family history of breast cancer 02/10/2009  . Absolute anemia 01/20/2009  . Extreme obesity ([place]) 11/08/2008  . Acute inflammation of the pancreas 01/23/2008  . NEOPLASM UNCERTAIN BHV OTH&UNSPEC DIGESTIVE ORGN 08/30/2007  . Abdominal pain 08/23/2007  . CD (contact dermatitis) 03/09/2007  . Blood pressure elevated without history of HTN 03/09/2007  . Infection of  the upper respiratory tract 11/02/2006  . Contusion of knee 10/31/2006  . Sprain of wrist 10/31/2006  . HLD (hyperlipidemia) 02/15/2006  . Routine general medical examination at a health care facility 02/08/2006  . Acute onset aura migraine 02/08/2006  . Benign neoplasm of skin of upper limb, including shoulder 02/08/2006  . Hypercholesterolemia without hypertriglyceridemia 07/20/2005    Past Surgical History:  Procedure Laterality Date  . ABDOMINAL HYSTERECTOMY    . CHOLECYSTECTOMY    . CHOLECYSTECTOMY  1987  . GASTROPLASTY  1987  . HERNIA REPAIR      Prior to Admission medications   Medication Sig Start Date End Date Taking? Authorizing Provider  atorvastatin (LIPITOR) 20 MG tablet TAKE 1 TABLET (20 MG TOTAL) BY MOUTH DAILY. 08/27/15   Jerrol Banana., MD  b complex vitamins capsule Take 2 tablets by mouth daily.    Historical Provider, MD  Calcium Carbonate (CALCIUM 600 PO) Take by mouth daily.    Historical Provider, MD  cholecalciferol (VITAMIN D) 1000 UNITS tablet Take 1,000 Units by mouth every other day. 2000 units on all other days    Historical Provider, MD  docusate sodium (COLACE) 100 MG capsule Take 1 tablet once or twice daily as needed for constipation while taking narcotic pain medicine 02/05/16   Hinda Kehr, MD  econazole nitrate 1 % cream Apply topically 2 (two) times daily. 02/25/15   Vickki Muff Chrismon, PA  HYDROcodone-acetaminophen (NORCO/VICODIN) 5-325 MG tablet Take 1-2 tablets by mouth every 4 (four) hours as needed for moderate pain. 02/05/16   Hinda Kehr, MD  MULTIPLE VITAMIN PO Take by mouth daily.    Historical Provider, MD  ondansetron (ZOFRAN ODT) 4 MG disintegrating tablet Allow 1-2 tablets to dissolve in your mouth every 8 hours as needed for nausea/vomiting 02/05/16   Hinda Kehr, MD  SUMAtriptan (IMITREX) 50 MG tablet Take 1 tablet by mouth as needed. 07/05/12   Historical Provider, MD  timolol (TIMOPTIC) 0.5 % ophthalmic solution Apply 1 drop to  eye 2 (two) times daily.    Historical Provider, MD    Allergies Tetanus toxoid  Family History  Problem Relation Age of Onset  . Hypertension Mother   . Cancer Mother   . Arthritis Mother   . Depression Mother   . Heart disease Father   . Cancer Sister   . Breast cancer Sister   . Cancer Brother   . Cancer Paternal Grandfather   . Hyperlipidemia Brother   . Heart disease Maternal Grandmother   . Stroke Maternal Grandmother   . Breast cancer Maternal Aunt     Social History Social History  Substance Use Topics  . Smoking status: Never Smoker  . Smokeless tobacco: Not on file  . Alcohol use 0.6 oz/week    1 Standard drinks or equivalent per week     Comment: rarely    Review of Systems Constitutional: No fever/chills Eyes: No visual changes. ENT: No sore throat. Cardiovascular: Denies chest pain. Respiratory: Denies shortness of breath. Gastrointestinal: Right flank and RLQ pain with numerous episodes of emesis Genitourinary: Negative for dysuria. Musculoskeletal: Negative for back pain. Skin: Negative for rash. Neurological: Negative for headaches, focal weakness or numbness.  10-point ROS otherwise negative.  ____________________________________________   PHYSICAL EXAM:  VITAL SIGNS: ED Triage Vitals  Enc Vitals Group     BP 02/05/16 1743 (!) 156/69     Pulse Rate 02/05/16 1743 70     Resp 02/05/16 1743 17     Temp 02/05/16 1743 97.7 F (36.5 C)     Temp Source 02/05/16 1743 Oral     SpO2 02/05/16 1743 100 %     Weight 02/05/16 1744 295 lb (133.8 kg)     Height 02/05/16 1744 5\' 3"  (1.6 m)     Head Circumference --      Peak Flow --      Pain Score 02/05/16 1744 8     Pain Loc --      Pain Edu? --      Excl. in [place]? --     Constitutional: Alert and oriented. Well appearing and in no acute distress. Eyes: Conjunctivae are normal. PERRL. EOMI. Head: Atraumatic. Nose: No congestion/rhinnorhea. Mouth/Throat: Mucous membranes are moist.   Oropharynx non-erythematous. Neck: No stridor.  No meningeal signs.   Cardiovascular: Normal rate, regular rhythm. Good peripheral circulation. Grossly normal heart sounds. Respiratory: Normal respiratory effort.  No retractions. Lungs CTAB. Gastrointestinal: Obese.  Soft and nontender throughout. No distention.  No CVA tenderness. Musculoskeletal: No lower extremity tenderness nor edema. No gross deformities of extremities. Neurologic:  Normal speech and language. No gross focal neurologic deficits are appreciated.  Skin:  Skin is warm, dry and intact. No rash noted. Psychiatric: Mood and affect are normal. Speech and behavior are normal.  ____________________________________________   LABS (all labs ordered are listed, but only abnormal results are displayed)  Labs Reviewed  COMPREHENSIVE METABOLIC PANEL - Abnormal; Notable for the following:       Result Value   Glucose, Bld 126 (*)    BUN 23 (*)  Creatinine, Ser 1.05 (*)    GFR calc non Af Amer 55 (*)    All other components within normal limits  CBC - Abnormal; Notable for the following:    RBC 3.68 (*)    All other components within normal limits  URINALYSIS, COMPLETE (UACMP) WITH MICROSCOPIC - Abnormal; Notable for the following:    Color, Urine AMBER (*)    APPearance CLOUDY (*)    Hgb urine dipstick LARGE (*)    Ketones, ur 20 (*)    Protein, ur 100 (*)    Bacteria, UA RARE (*)    Squamous Epithelial / LPF 0-5 (*)    All other components within normal limits  LIPASE, BLOOD   ____________________________________________  EKG  None - EKG not ordered by ED physician ____________________________________________  RADIOLOGY   No results found.  ____________________________________________   PROCEDURES  Procedure(s) performed:   Procedures   Critical Care performed: No ____________________________________________   INITIAL IMPRESSION / ASSESSMENT AND PLAN / ED COURSE  Pertinent labs & imaging  results that were available during my care of the patient were reviewed by me and considered in my medical decision making (see chart for details).  The patient has microscopic hematuria and signs and symptoms most consistent with a kidney stone.  However she is currently pain-free at this time with normal vital signs and otherwise normal labs.  There is nothing to suggest appendicitis or other acute or emergent medical condition.  Given that she is completely pain-free now and has no evidence of urinary tract infection, I do not feel that she would benefit from a CT scan given that she has most likely passed the stone.  I had a long conversation with her about this and she understands and agrees.  I will prescribe her Norco and Zofran in case she develops recurrent symptoms.  She already has an appointment with her primary care doctor tomorrow with him she will discuss all of the findings.  I gave my usual and customary return precautions.         ____________________________________________  FINAL CLINICAL IMPRESSION(S) / ED DIAGNOSES  Final diagnoses:  Right sided abdominal pain  Acute right flank pain  Hematuria, unspecified type     MEDICATIONS GIVEN DURING THIS VISIT:  Medications - No data to display   NEW OUTPATIENT MEDICATIONS STARTED DURING THIS VISIT:  New Prescriptions   DOCUSATE SODIUM (COLACE) 100 MG CAPSULE    Take 1 tablet once or twice daily as needed for constipation while taking narcotic pain medicine   HYDROCODONE-ACETAMINOPHEN (NORCO/VICODIN) 5-325 MG TABLET    Take 1-2 tablets by mouth every 4 (four) hours as needed for moderate pain.   ONDANSETRON (ZOFRAN ODT) 4 MG DISINTEGRATING TABLET    Allow 1-2 tablets to dissolve in your mouth every 8 hours as needed for nausea/vomiting    Modified Medications   No medications on file    Discontinued Medications   No medications on file     Note:  This document was prepared using Dragon voice recognition software  and may include unintentional dictation errors.    Hinda Kehr, MD 02/05/16 2059

## 2016-02-05 NOTE — Discharge Instructions (Signed)
You have been seen in the Emergency Department (ED) today for pain that we believe based on your workup, is caused by kidney stones.  As we have discussed, please drink plenty of fluids.  Please make a follow up appointment with the physician(s) listed elsewhere in this documentation.  Since your pain has completely resolved and there is no sign of urinary tract infection (just some red blood cells in your urine), we do not feel you need a CT scan at this time.  You may take pain medication as needed but ONLY as prescribed.  Please also take your prescribed Flomax daily.  We also recommend that you take over-the-counter ibuprofen regularly according to label instructions over the next 5 days.  Take it with meals to minimize stomach discomfort.  Please see your doctor as soon as possible as stones may take 1-3 weeks to pass and you may require additional care or medications.  Do not drink alcohol, drive or participate in any other potentially dangerous activities while taking opiate pain medication as it may make you sleepy. Do not take this medication with any other sedating medications, either prescription or over-the-counter. If you were prescribed Percocet or Vicodin, do not take these with acetaminophen (Tylenol) as it is already contained within these medications.   Take Norco as needed for severe pain.  This medication is an opiate (or narcotic) pain medication and can be habit forming.  Use it as little as possible to achieve adequate pain control.  Do not use or use it with extreme caution if you have a history of opiate abuse or dependence.  If you are on a pain contract with your primary care doctor or a pain specialist, be sure to let them know you were prescribed this medication today from the [facility] Emergency Department.  This medication is intended for your use only - do not give any to anyone else and keep it in a secure place where nobody else, especially children, have access to  it.  It will also cause or worsen constipation, so you may want to consider taking an over-the-counter stool softener while you are taking this medication.  Return to the Emergency Department (ED) or call your doctor if you have any worsening pain, fever, painful urination, are unable to urinate, or develop other symptoms that concern you.

## 2016-02-05 NOTE — Telephone Encounter (Signed)
Agree with this plan.

## 2016-02-05 NOTE — ED Triage Notes (Signed)
Patient presents to ED via POV from home with c/o RLQ pain, radiating to back. Patient A&O x4. VSS. Patient states she has vomited 9 times in 24 hours.

## 2016-02-06 ENCOUNTER — Ambulatory Visit (INDEPENDENT_AMBULATORY_CARE_PROVIDER_SITE_OTHER): Payer: 59 | Admitting: Family Medicine

## 2016-02-06 ENCOUNTER — Encounter: Payer: Self-pay | Admitting: Family Medicine

## 2016-02-06 ENCOUNTER — Ambulatory Visit
Admission: RE | Admit: 2016-02-06 | Discharge: 2016-02-06 | Disposition: A | Discharge status: Home / Self Care | Payer: 59 | Source: Ambulatory Visit | Attending: Family Medicine | Admitting: Family Medicine

## 2016-02-06 VITALS — BP 138/84 | HR 78 | Temp 98.2°F | Resp 16 | Ht 60.0 in | Wt 293.0 lb

## 2016-02-06 DIAGNOSIS — Z87442 Personal history of urinary calculi: Secondary | ICD-10-CM | POA: Diagnosis not present

## 2016-02-06 DIAGNOSIS — M1711 Unilateral primary osteoarthritis, right knee: Secondary | ICD-10-CM | POA: Insufficient documentation

## 2016-02-06 DIAGNOSIS — M25561 Pain in right knee: Secondary | ICD-10-CM

## 2016-02-06 NOTE — Patient Instructions (Signed)
Dietary Guidelines to Help Prevent Kidney Stones Your risk of kidney stones can be decreased by adjusting the foods you eat. The most important thing you can do is drink enough fluid. You should drink enough fluid to keep your urine clear or pale yellow. The following guidelines provide specific information for the type of kidney stone you have had. Guidelines according to type of kidney stone Calcium Oxalate Kidney Stones  Reduce the amount of salt you eat. Foods that have a lot of salt cause your body to release excess calcium into your urine. The excess calcium can combine with a substance called oxalate to form kidney stones.  Reduce the amount of animal protein you eat if the amount you eat is excessive. Animal protein causes your body to release excess calcium into your urine. Ask your dietitian how much protein from animal sources you should be eating.  Avoid foods that are high in oxalates. If you take vitamins, they should have less than 500 mg of vitamin C. Your body turns vitamin C into oxalates. You do not need to avoid fruits and vegetables high in vitamin C. Calcium Phosphate Kidney Stones  Reduce the amount of salt you eat to help prevent the release of excess calcium into your urine.  Reduce the amount of animal protein you eat if the amount you eat is excessive. Animal protein causes your body to release excess calcium into your urine. Ask your dietitian how much protein from animal sources you should be eating.  Get enough calcium from food or take a calcium supplement (ask your dietitian for recommendations). Food sources of calcium that do not increase your risk of kidney stones include:  Broccoli.  Dairy products, such as cheese and yogurt.  Pudding. Uric Acid Kidney Stones  Do not have more than 6 oz of animal protein per day. Food sources Animal Protein Sources  Meat (all types).  Poultry.  Eggs.  Fish, seafood. Foods High in Salt  Salt seasonings.  Soy  sauce.  Teriyaki sauce.  Cured and processed meats.  Salted crackers and snack foods.  Fast food.  Canned soups and most canned foods. Foods High in Oxalates  Grains:  Amaranth.  Barley.  Grits.  Wheat germ.  Bran.  Buckwheat flour.  All bran cereals.  Pretzels.  Whole wheat bread.  Vegetables:  Beans (wax).  Beets and beet greens.  Collard greens.  Eggplant.  [place]scarole.  Leeks.  Okra.  Parsley.  Rutabagas.  Spinach.  Swiss chard.  Tomato paste.  Fried potatoes.  Sweet potatoes.  Fruits:  Red currants.  Figs.  Kiwi.  Rhubarb.  Meat and Other Protein Sources:  Beans (dried).  Soy burgers and other soybean products.  Miso.  Nuts (peanuts, almonds, pecans, cashews, hazelnuts).  Nut butters.  Sesame seeds and tahini (paste made of sesame seeds).  Poppy seeds.  Beverages:  Chocolate drink mixes.  Soy milk.  Instant iced tea.  Juices made from high-oxalate fruits or vegetables.  Other:  Carob.  Chocolate.  Fruitcake.  Marmalades. This information is not intended to replace advice given to you by your health care provider. Make sure you discuss any questions you have with your health care provider. Document Released: 04/24/2010 Document Revised: 06/05/2015 Document Reviewed: 11/24/2012 Elsevier Interactive Patient Education  2017 Elsevier Inc.  

## 2016-02-06 NOTE — Progress Notes (Signed)
Patient: Karen [name] Female    DOB: 02-13-1950   66 y.o.   MRN: [id number] Visit Date: 02/06/2016  Today's Provider: Vernie Murders, PA   Chief Complaint  Patient presents with  . Knee Pain   Subjective:    HPI Patient comes in today c/o right knee pain. She reports that she reports that she has had knee pain for "quite sometime". She reports that the pain is constant, but worsening.   Patient also mentions that she was seen in the ER last night (02/05/2016) due to right sided abdominal pain. She reports that it was due to a kidney stone. She reports that she passed 1 before she went into the ER, and she passed another one around 4am this morning. Patient feels that she may have another stone because she is still having pain. She denies any blood in her urine. However, while she was in the ER they did tell her she had microscopic blood in her urine. She has not taken anything for the pain since her ER visit.   Patient Active Problem List   Diagnosis Date Noted  . Anxiety state 04/13/2014  . Acute bronchitis 04/13/2014  . Bronchitis 04/13/2014  . Cerebrovascular accident, old 04/13/2014  . Chicken pox 04/13/2014  . Condyloma acuminatum 04/13/2014  . Screening for depression 04/13/2014  . Bariatric surgery status 04/13/2014  . Cerumen impaction 04/13/2014  . Received influenza vaccination at hospital 04/13/2014  . Screening for osteoporosis 04/13/2014  . Flu vaccine need 04/13/2014  . Pain in the wrist 04/13/2014  . Mucositis oral 04/13/2014  . Avitaminosis D 04/13/2014  . Gravida 0 04/13/2014  . Screening breast examination 02/10/2009  . Family history of breast cancer 02/10/2009  . Absolute anemia 01/20/2009  . Extreme obesity ([place]) 11/08/2008  . Acute inflammation of the pancreas 01/23/2008  . NEOPLASM UNCERTAIN BHV OTH&UNSPEC DIGESTIVE ORGN 08/30/2007  . Abdominal pain 08/23/2007  . CD (contact dermatitis) 03/09/2007  . Blood pressure elevated without history of  HTN 03/09/2007  . Infection of the upper respiratory tract 11/02/2006  . Contusion of knee 10/31/2006  . Sprain of wrist 10/31/2006  . HLD (hyperlipidemia) 02/15/2006  . Routine general medical examination at a health care facility 02/08/2006  . Acute onset aura migraine 02/08/2006  . Benign neoplasm of skin of upper limb, including shoulder 02/08/2006  . Hypercholesterolemia without hypertriglyceridemia 07/20/2005   Past Surgical History:  Procedure Laterality Date  . ABDOMINAL HYSTERECTOMY    . CHOLECYSTECTOMY    . CHOLECYSTECTOMY  1987  . GASTROPLASTY  1987  . HERNIA REPAIR     Family History  Problem Relation Age of Onset  . Hypertension Mother   . Cancer Mother   . Arthritis Mother   . Depression Mother   . Heart disease Father   . Cancer Sister   . Breast cancer Sister   . Cancer Brother   . Cancer Paternal Grandfather   . Hyperlipidemia Brother   . Heart disease Maternal Grandmother   . Stroke Maternal Grandmother   . Breast cancer Maternal Aunt     Allergies  Allergen Reactions  . Tetanus Toxoid     Multiple joint pains.    Current Outpatient Prescriptions:  .  atorvastatin (LIPITOR) 20 MG tablet, TAKE 1 TABLET (20 MG TOTAL) BY MOUTH DAILY., Disp: 30 tablet, Rfl: 5 .  b complex vitamins capsule, Take 2 tablets by mouth daily., Disp: , Rfl:  .  Calcium Carbonate (CALCIUM 600 PO),  Take by mouth daily., Disp: , Rfl:  .  cholecalciferol (VITAMIN D) 1000 UNITS tablet, Take 1,000 Units by mouth every other day. 2000 units on all other days, Disp: , Rfl:  .  docusate sodium (COLACE) 100 MG capsule, Take 1 tablet once or twice daily as needed for constipation while taking narcotic pain medicine, Disp: 30 capsule, Rfl: 0 .  econazole nitrate 1 % cream, Apply topically 2 (two) times daily., Disp: 15 g, Rfl: 1 .  HYDROcodone-acetaminophen (NORCO/VICODIN) 5-325 MG tablet, Take 1-2 tablets by mouth every 4 (four) hours as needed for moderate pain., Disp: 15 tablet, Rfl:  0 .  MULTIPLE VITAMIN PO, Take by mouth daily., Disp: , Rfl:  .  ondansetron (ZOFRAN ODT) 4 MG disintegrating tablet, Allow 1-2 tablets to dissolve in your mouth every 8 hours as needed for nausea/vomiting, Disp: 30 tablet, Rfl: 0 .  SUMAtriptan (IMITREX) 50 MG tablet, Take 1 tablet by mouth as needed., Disp: , Rfl:  .  timolol (TIMOPTIC) 0.5 % ophthalmic solution, Apply 1 drop to eye 2 (two) times daily., Disp: , Rfl:   Review of Systems  Constitutional: Positive for activity change and fatigue.  Genitourinary: Negative for hematuria.  Musculoskeletal: Positive for arthralgias, gait problem and myalgias.    Social History  Substance Use Topics  . Smoking status: Never Smoker  . Smokeless tobacco: Never Used  . Alcohol use 0.6 oz/week    1 Standard drinks or equivalent per week     Comment: rarely   Objective:   BP 138/84 (BP Location: Right Arm, Patient Position: Sitting, Cuff Size: Large)   Pulse 78   Temp 98.2 F (36.8 C)   Resp 16   Wt 293 lb (132.9 kg)   SpO2 97%   BMI 51.90 kg/m   Physical Exam  Constitutional: She is oriented to person, place, and time. She appears well-developed and well-nourished.  HENT:  Head: Normocephalic.  Eyes: Conjunctivae are normal.  Neck: Neck supple.  Cardiovascular: Normal rate and regular rhythm.   Pulmonary/Chest: Effort normal and breath sounds normal.  Abdominal: Soft. Bowel sounds are normal.  Musculoskeletal:  Tenderness inferior-lateral right knee to palpate. Slight crepitus behind patella to test ROM. No click or locking now.  Neurological: She is alert and oriented to person, place, and time.      Assessment & Plan:     1. Acute pain of right knee Recent flare of right knee pain with some occasional locking sensation. No known injury. X-rays in 2008 showed some degenerative disease. Some relief with use of Meloxicam 15 mg (took it mistakenly TID for 3 days). Will get recheck of x-rays. May need neoprene sleeve for support  and/or referral to orthopedist for evaluation and possible cortisone injection. - DG Knee Complete 4 Views Right  2. History of kidney stones Had right flank and abdomen pain with nausea and vomiting yesterday. Went to the ER and found to have microscopic hematuria. Symptoms were consistent with kidney stone but CT scan was not done siche she was pain-free during the ER visit. The EDP felt she had passed a kidney stone. States she passed another stone at 4:00am today and has no further flank or abdominal pain. Still has prescriptions for Norco and Zofran if pain returns. May need to recheck and get CT scan and/or refer to an urologist if any return of pain. Will be sent home out of work until Monday. Encouraged to increase fluid intake.       Vernie Murders,  [facility]

## 2016-02-09 ENCOUNTER — Telehealth: Payer: Self-pay

## 2016-02-09 NOTE — Telephone Encounter (Signed)
Lmtcb. Emily Drozdowski, CMA  

## 2016-02-09 NOTE — Telephone Encounter (Signed)
-----   Message from [place], [place] sent at 02/06/2016  5:34 PM EST ----- X-ray confirms worsening of arthritis in the right knee - "bone-on-bone" medially and osteophytes (spurs) worsened also. If no better on the Meloxicam 15 mg ONCE a day with a meal, will need referral to an orthopedist for possible cortisone injection. May need joint replacement one day soon.

## 2016-02-10 NOTE — Telephone Encounter (Signed)
Advised  ED 

## 2016-03-05 ENCOUNTER — Other Ambulatory Visit: Payer: Self-pay | Admitting: Family Medicine

## 2016-03-05 DIAGNOSIS — Z1231 Encounter for screening mammogram for malignant neoplasm of breast: Secondary | ICD-10-CM

## 2016-03-12 ENCOUNTER — Other Ambulatory Visit: Payer: Self-pay | Admitting: Family Medicine

## 2016-03-12 NOTE — Telephone Encounter (Signed)
Simona Huh, I think this is your patient Thanks Jiles Garter

## 2016-03-22 ENCOUNTER — Other Ambulatory Visit: Payer: Self-pay | Admitting: Family Medicine

## 2016-03-22 ENCOUNTER — Telehealth: Payer: Self-pay

## 2016-03-22 MED ORDER — ATORVASTATIN CALCIUM 20 MG PO TABS: 20.0000 mg | ORAL_TABLET | Freq: Every day | ORAL | 1 refills | Status: DC

## 2016-03-22 NOTE — Telephone Encounter (Signed)
Done

## 2016-03-22 NOTE — Telephone Encounter (Signed)
Request received from [facility] for a 90 days supply of atorvastatin (LIPITOR) 20 MG tablet

## 2016-04-19 ENCOUNTER — Ambulatory Visit
Admission: RE | Admit: 2016-04-19 | Discharge: 2016-04-19 | Disposition: A | Discharge status: Home / Self Care | Payer: 59 | Source: Ambulatory Visit | Attending: Family Medicine | Admitting: Family Medicine

## 2016-04-19 DIAGNOSIS — R928 Other abnormal and inconclusive findings on diagnostic imaging of breast: Secondary | ICD-10-CM | POA: Insufficient documentation

## 2016-04-19 DIAGNOSIS — Z1231 Encounter for screening mammogram for malignant neoplasm of breast: Secondary | ICD-10-CM | POA: Insufficient documentation

## 2016-04-20 ENCOUNTER — Telehealth: Payer: Self-pay

## 2016-04-20 NOTE — Telephone Encounter (Signed)
Patient advised. Advised patient to call us back if she has not heard anything from radiology about scheduling diagnostic mammogram by Friday.

## 2016-04-20 NOTE — Telephone Encounter (Signed)
-----   Message from [place] sent at 04/20/2016 11:11 AM EDT ----- [facility]  ED

## 2016-04-21 ENCOUNTER — Other Ambulatory Visit: Payer: Self-pay | Admitting: Family Medicine

## 2016-04-21 DIAGNOSIS — R928 Other abnormal and inconclusive findings on diagnostic imaging of breast: Secondary | ICD-10-CM

## 2016-04-21 DIAGNOSIS — N6489 Other specified disorders of breast: Secondary | ICD-10-CM

## 2016-04-23 ENCOUNTER — Other Ambulatory Visit: Payer: Self-pay | Admitting: Family Medicine

## 2016-04-23 ENCOUNTER — Telehealth: Payer: Self-pay | Admitting: Family Medicine

## 2016-04-23 DIAGNOSIS — N6489 Other specified disorders of breast: Secondary | ICD-10-CM

## 2016-04-23 DIAGNOSIS — R928 Other abnormal and inconclusive findings on diagnostic imaging of breast: Secondary | ICD-10-CM

## 2016-04-23 NOTE — Telephone Encounter (Signed)
Diagnostic mammogram and ultrasound was ordered on 04/21/16. [place] they closed at 3 pm today. Spoke with radiology and they showed the order but no appointment scheduled. Appointment needs to be scheduled on Monday for diagnostic mammo and U/S.   Patient advised appointment will be made on Monday when [place] reopens. Patient prefers appointment to be in the morning. She is not available Monday 4/16 , Tuesday 4/17, and Thursday 4/19 at 10 am. Can leave message on patient's home phone with appointment time or call after 2 pm. CB# [phone].

## 2016-04-23 NOTE — Telephone Encounter (Signed)
Pt request that Karen [name] return her call b/c she hasn't heard anything about scheduling her Dx mammogram. Thanks TNP

## 2016-04-26 ENCOUNTER — Encounter: Payer: Self-pay | Admitting: Family Medicine

## 2016-04-26 NOTE — Telephone Encounter (Signed)
Spoke with Aldona Bar at [facility]. She states she will be contacting patient today to schedule appointment for additional views.

## 2016-04-29 ENCOUNTER — Ambulatory Visit (INDEPENDENT_AMBULATORY_CARE_PROVIDER_SITE_OTHER): Payer: 59 | Admitting: Family Medicine

## 2016-04-29 ENCOUNTER — Encounter: Payer: Self-pay | Admitting: Family Medicine

## 2016-04-29 VITALS — BP 140/88 | HR 69 | Temp 98.3°F | Ht 62.5 in | Wt 293.0 lb

## 2016-04-29 DIAGNOSIS — M1711 Unilateral primary osteoarthritis, right knee: Secondary | ICD-10-CM

## 2016-04-29 DIAGNOSIS — Z9884 Bariatric surgery status: Secondary | ICD-10-CM

## 2016-04-29 DIAGNOSIS — E782 Mixed hyperlipidemia: Secondary | ICD-10-CM | POA: Diagnosis not present

## 2016-04-29 DIAGNOSIS — N951 Menopausal and female climacteric states: Secondary | ICD-10-CM

## 2016-04-29 DIAGNOSIS — Z0000 Encounter for general adult medical examination without abnormal findings: Secondary | ICD-10-CM

## 2016-04-29 DIAGNOSIS — E559 Vitamin D deficiency, unspecified: Secondary | ICD-10-CM

## 2016-04-29 DIAGNOSIS — Z114 Encounter for screening for human immunodeficiency virus [HIV]: Secondary | ICD-10-CM

## 2016-04-29 MED ORDER — ESTROGENS, CONJUGATED 0.625 MG/GM VA CREA: 1.0000 | TOPICAL_CREAM | Freq: Every day | VAGINAL | 12 refills | Status: DC

## 2016-04-29 NOTE — Progress Notes (Signed)
Patient: Karen [name], Female    DOB: 03-26-1950, 66 y.o.   MRN: [id number] Visit Date: 04/29/2016  Today's Provider: Vernie Murders, PA   Chief Complaint  Patient presents with  . Annual Exam   Subjective:    Annual physical exam Karen [name] is a 66 y.o. female who presents today for health maintenance and complete physical. She feels fairly well. She reports exercising none at this time. She reports she is sleeping well.  -----------------------------------------------------------------   Review of Systems  Constitutional: Negative.   HENT: Negative.   Eyes: Negative.   Respiratory: Negative.   Cardiovascular: Negative.   Gastrointestinal: Negative.   Endocrine: Negative.   Genitourinary: Negative.        Vaginal dryness   Musculoskeletal: Positive for arthralgias and myalgias.  Skin: Negative.   Allergic/Immunologic: Negative.   Neurological: Positive for headaches.  Hematological: Negative.   Psychiatric/Behavioral: Negative.     Social History      She  reports that she has never smoked. She has never used smokeless tobacco. She reports that she drinks about 0.6 oz of alcohol per week . She reports that she does not use drugs.       Social History   Social History  . Marital status: Married    Spouse name: N/A  . Number of children: N/A  . Years of education: N/A   Social History Main Topics  . Smoking status: Never Smoker  . Smokeless tobacco: Never Used  . Alcohol use 0.6 oz/week    1 Standard drinks or equivalent per week     Comment: rarely  . Drug use: No  . Sexual activity: Not Asked   Other Topics Concern  . None   Social History Narrative  . None    Past Medical History:  Diagnosis Date  . Anemia   . Hyperlipidemia   . Vitamin D deficiency      Patient Active Problem List   Diagnosis Date Noted  . Anxiety state 04/13/2014  . Acute bronchitis 04/13/2014  . Bronchitis 04/13/2014  . Cerebrovascular accident, old  04/13/2014  . Chicken pox 04/13/2014  . Condyloma acuminatum 04/13/2014  . Screening for depression 04/13/2014  . Bariatric surgery status 04/13/2014  . Cerumen impaction 04/13/2014  . Received influenza vaccination at hospital 04/13/2014  . Screening for osteoporosis 04/13/2014  . Flu vaccine need 04/13/2014  . Pain in the wrist 04/13/2014  . Mucositis oral 04/13/2014  . Avitaminosis D 04/13/2014  . Gravida 0 04/13/2014  . Screening breast examination 02/10/2009  . Family history of breast cancer 02/10/2009  . Absolute anemia 01/20/2009  . Extreme obesity 11/08/2008  . Acute inflammation of the pancreas 01/23/2008  . NEOPLASM UNCERTAIN BHV OTH&UNSPEC DIGESTIVE ORGN 08/30/2007  . Abdominal pain 08/23/2007  . CD (contact dermatitis) 03/09/2007  . Blood pressure elevated without history of HTN 03/09/2007  . Infection of the upper respiratory tract 11/02/2006  . Contusion of knee 10/31/2006  . Sprain of wrist 10/31/2006  . HLD (hyperlipidemia) 02/15/2006  . Routine general medical examination at a health care facility 02/08/2006  . Acute onset aura migraine 02/08/2006  . Benign neoplasm of skin of upper limb, including shoulder 02/08/2006  . Hypercholesterolemia without hypertriglyceridemia 07/20/2005    Past Surgical History:  Procedure Laterality Date  . ABDOMINAL HYSTERECTOMY    . CHOLECYSTECTOMY    . CHOLECYSTECTOMY  1987  . GASTROPLASTY  1987  . HERNIA REPAIR      Family  History        Family Status  Relation Status  . Mother Deceased  . Father Deceased  . Sister Deceased  . Brother Deceased  . Paternal Grandfather Deceased  . Brother Alive  . Maternal Grandmother   . Maternal Aunt         Her family history includes Arthritis in her mother; Breast cancer in her maternal aunt and sister; Cancer in her brother, mother, paternal grandfather, and sister; Depression in her mother; Heart disease in her father and maternal grandmother; Hyperlipidemia in her brother;  Hypertension in her mother; Stroke in her maternal grandmother.     Allergies  Allergen Reactions  . Tetanus Toxoid     Multiple joint pains.    Current Outpatient Prescriptions:  .  atorvastatin (LIPITOR) 20 MG tablet, Take 1 tablet (20 mg total) by mouth daily., Disp: 90 tablet, Rfl: 1 .  b complex vitamins capsule, Take 2 tablets by mouth daily., Disp: , Rfl:  .  Calcium Carbonate (CALCIUM 600 PO), Take by mouth daily., Disp: , Rfl:  .  cholecalciferol (VITAMIN D) 1000 UNITS tablet, Take 1,000 Units by mouth every other day. 2000 units on all other days, Disp: , Rfl:  .  docusate sodium (COLACE) 100 MG capsule, Take 1 tablet once or twice daily as needed for constipation while taking narcotic pain medicine, Disp: 30 capsule, Rfl: 0 .  econazole nitrate 1 % cream, Apply topically 2 (two) times daily., Disp: 15 g, Rfl: 1 .  MULTIPLE VITAMIN PO, Take by mouth daily., Disp: , Rfl:  .  ondansetron (ZOFRAN ODT) 4 MG disintegrating tablet, Allow 1-2 tablets to dissolve in your mouth every 8 hours as needed for nausea/vomiting, Disp: 30 tablet, Rfl: 0 .  SUMAtriptan (IMITREX) 50 MG tablet, Take 1 tablet by mouth as needed., Disp: , Rfl:  .  timolol (TIMOPTIC) 0.25 % ophthalmic solution, INSTILL ONE DROP IN LEFT EYE TWICE A DAY, Disp: , Rfl: 99 .  timolol (TIMOPTIC) 0.5 % ophthalmic solution, Apply 1 drop to eye 2 (two) times daily., Disp: , Rfl:    Patient Care Team: Margo Common, PA as PCP - General (Physician Assistant)      Objective:   Vitals: BP 140/88 (BP Location: Right Arm, Patient Position: Sitting, Cuff Size: Large)   Pulse 69   Temp 98.3 F (36.8 C) (Oral)   Ht 5' 2.5" (1.588 m)   Wt 293 lb (132.9 kg)   SpO2 97%   BMI 52.74 kg/m    Vitals:   04/29/16 0959  BP: 140/88  Pulse: 69  Temp: 98.3 F (36.8 C)  TempSrc: Oral  SpO2: 97%  Weight: 293 lb (132.9 kg)  Height: 5' 2.5" (1.588 m)     Physical Exam  Constitutional: She is oriented to person, place, and  time. She appears well-developed and well-nourished.  HENT:  Head: Normocephalic and atraumatic.  Right Ear: External ear normal.  Left Ear: External ear normal.  Nose: Nose normal.  Mouth/Throat: Oropharynx is clear and moist.  Eyes: Conjunctivae and EOM are normal. Pupils are equal, round, and reactive to light. Right eye exhibits no discharge.  Neck: Normal range of motion. Neck supple. No tracheal deviation present. No thyromegaly present.  Cardiovascular: Normal rate, regular rhythm, normal heart sounds and intact distal pulses.   No murmur heard. Pulmonary/Chest: Effort normal and breath sounds normal. No respiratory distress. She has no wheezes. She has no rales. She exhibits no tenderness.  Abdominal: Soft. Bowel sounds  are normal. She exhibits no distension and no mass. There is no tenderness. There is no rebound and no guarding.  Genitourinary:  Genitourinary Comments: Deferred with normal exam in 2017. Always difficult to examine and visualize anatomy due to severe obesity.  Musculoskeletal: Normal range of motion. She exhibits no edema or tenderness.  Tenderness in the right knee to fully flex. Difficult to evaluate due to severe obesity. Good pulses.  Lymphadenopathy:    She has no cervical adenopathy.  Neurological: She is alert and oriented to person, place, and time. She has normal reflexes. No cranial nerve deficit. She exhibits normal muscle tone. Coordination normal.  Skin: Skin is warm and dry. No rash noted. No erythema.  Psychiatric: She has a normal mood and affect. Her behavior is normal. Judgment and thought content normal.    Depression Screen PHQ 2/9 Scores 04/29/2016 03/20/2015  PHQ - 2 Score 0 0  PHQ- 9 Score 0 -    Assessment & Plan:     Routine Health Maintenance and Physical Exam  Exercise Activities and Dietary recommendations Goals    Need to exercise 30 minutes 4 days a week as arthritic knees will allow. Should consider water aerobics. Also, need  to get back on a lower calorie diet to lose weight.      Immunization History  Administered Date(s) Administered  . Influenza Split 11/08/2008, 02/23/2011, 11/23/2011  . Influenza,inj,Quad PF,36+ Mos 11/08/2013, 10/24/2014  . Influenza-Unspecified 11/08/2013  . Zoster 03/20/2015    Health Maintenance  Topic Date Due  . HIV Screening  06/20/1967  . PAP SMEAR  06/19/1973  . COLONOSCOPY  03/17/2016  . INFLUENZA VACCINE  08/11/2016  . MAMMOGRAM  04/20/2018  . TETANUS/TDAP  03/19/2025  . Hepatitis C Screening  Completed     Discussed health benefits of physical activity, and encouraged her to engage in regular exercise appropriate for her age and condition.    -------------------------------------------------------------------- 1. Annual physical exam Stable general health with severe obesity despite bariatric surgery with regaining lost weight. Allergic to tetanus. Wants to consider Cologuard instead of colonoscopy but needs to check with insurance coverage before proceeding. Recheck routine labs and follow up pending reports. - CBC with Differential/Platelet - Comprehensive metabolic panel - Lipid panel  2. Tricompartment osteoarthritis of right knee Continues to have limit ROM and pain in the right knee. X-rays on 02-06-16 showed progression of tricompartment to severe arthritis. Must lose weight to help joints and before orthopedist with consider surgery. Doesn't want to consider any NSAID with history of bariatric surgery. Consider water aerobics.  3. Mixed hyperlipidemia Continues to take the Lipitor 20 mg qd without side effects. Must work on exercise and diet to lose some weight. Recheck CMP, Lipid panel and TSH. - Comprehensive metabolic panel - Lipid panel - TSH  4. Bariatric surgery status Had gastric bypass in 1987 in [place]. Has regained majority of the initial weight loss and BMI over 52 today. Still eating 4-5 small meals a day. Will check routine follow up labs  and encouraged to exercise regularly 3-4 days/week. - CBC with Differential/Platelet - Comprehensive metabolic panel - Hemoglobin A1c - VITAMIN D 25 Hydroxy (Vit-D Deficiency, Fractures)  5. Avitaminosis D Still taking 1000 IU of Vitamin-D daily. Will check CBC and vitamin-D level. - CBC with Differential/Platelet - VITAMIN D 25 Hydroxy (Vit-D Deficiency, Fractures)  6. Vaginal dryness, menopausal Denies hot flashes. Had vaginal hysterectomy in 1992 due to prolapse of uterus. Intercourse uncomfortable due to vaginal dryness. No discharge or  bleeding. Unable to accomplish pelvic exam due to severe obesity and embarrassment. Will give premarin vaginal cream and recheck in 3 months if needed. - conjugated estrogens (PREMARIN) vaginal cream; Place 1 Applicatorful vaginally daily.  Dispense: 42.5 g; Refill: 12  7. Screening for HIV (human immunodeficiency virus) - HIV antibody    Vernie Murders, PA  [facility]

## 2016-04-30 ENCOUNTER — Telehealth: Payer: Self-pay | Admitting: Family Medicine

## 2016-04-30 LAB — CBC WITH DIFFERENTIAL/PLATELET
BASOS ABS: 0 10*3/uL (ref 0.0–0.2)
Basos: 1 %
EOS (ABSOLUTE): 0.2 10*3/uL (ref 0.0–0.4)
Eos: 4 %
HEMATOCRIT: 38.9 % (ref 34.0–46.6)
Hemoglobin: 12.6 g/dL (ref 11.1–15.9)
IMMATURE GRANULOCYTES: 0 %
Immature Grans (Abs): 0 10*3/uL (ref 0.0–0.1)
LYMPHS: 40 %
Lymphocytes Absolute: 1.8 10*3/uL (ref 0.7–3.1)
MCH: 31.9 pg (ref 26.6–33.0)
MCHC: 32.4 g/dL (ref 31.5–35.7)
MCV: 99 fL — AB (ref 79–97)
Monocytes Absolute: 0.3 10*3/uL (ref 0.1–0.9)
Monocytes: 8 %
NEUTROS PCT: 47 %
Neutrophils Absolute: 2.1 10*3/uL (ref 1.4–7.0)
PLATELETS: 232 10*3/uL (ref 150–379)
RBC: 3.95 x10E6/uL (ref 3.77–5.28)
RDW: 13.1 % (ref 12.3–15.4)
WBC: 4.5 10*3/uL (ref 3.4–10.8)

## 2016-04-30 LAB — COMPREHENSIVE METABOLIC PANEL
A/G RATIO: 1.4 (ref 1.2–2.2)
ALT: 13 IU/L (ref 0–32)
AST: 19 IU/L (ref 0–40)
Albumin: 3.9 g/dL (ref 3.6–4.8)
Alkaline Phosphatase: 62 IU/L (ref 39–117)
BILIRUBIN TOTAL: 0.7 mg/dL (ref 0.0–1.2)
BUN/Creatinine Ratio: 19 (ref 12–28)
BUN: 15 mg/dL (ref 8–27)
CHLORIDE: 102 mmol/L (ref 96–106)
CO2: 25 mmol/L (ref 18–29)
Calcium: 10.2 mg/dL (ref 8.7–10.3)
Creatinine, Ser: 0.81 mg/dL (ref 0.57–1.00)
GFR calc non Af Amer: 78 mL/min/{1.73_m2} (ref >59)
GFR, EST AFRICAN AMERICAN: 89 mL/min/{1.73_m2} (ref >59)
GLOBULIN, TOTAL: 2.7 g/dL (ref 1.5–4.5)
Glucose: 105 mg/dL — ABNORMAL HIGH (ref 65–99)
POTASSIUM: 4.3 mmol/L (ref 3.5–5.2)
SODIUM: 142 mmol/L (ref 134–144)
TOTAL PROTEIN: 6.6 g/dL (ref 6.0–8.5)

## 2016-04-30 LAB — HEMOGLOBIN A1C
Est. average glucose Bld gHb Est-mCnc: 100 mg/dL
Hgb A1c MFr Bld: 5.1 % (ref 4.8–5.6)

## 2016-04-30 LAB — LIPID PANEL
Chol/HDL Ratio: 3.4 ratio (ref 0.0–4.4)
Cholesterol, Total: 169 mg/dL (ref 100–199)
HDL: 50 mg/dL (ref >39)
LDL Calculated: 93 mg/dL (ref 0–99)
Triglycerides: 131 mg/dL (ref 0–149)
VLDL CHOLESTEROL CAL: 26 mg/dL (ref 5–40)

## 2016-04-30 LAB — VITAMIN D 25 HYDROXY (VIT D DEFICIENCY, FRACTURES): VIT D 25 HYDROXY: 25.2 ng/mL — AB (ref 30.0–100.0)

## 2016-04-30 LAB — HIV ANTIBODY (ROUTINE TESTING W REFLEX): HIV SCREEN 4TH GENERATION: NONREACTIVE

## 2016-04-30 LAB — TSH: TSH: 1.52 u[IU]/mL (ref 0.450–4.500)

## 2016-04-30 NOTE — Telephone Encounter (Signed)
Pt is returning call.  CB#[phone]/MW

## 2016-05-03 ENCOUNTER — Ambulatory Visit
Admission: RE | Admit: 2016-05-03 | Discharge: 2016-05-03 | Disposition: A | Discharge status: Home / Self Care | Payer: 59 | Source: Ambulatory Visit | Attending: Family Medicine | Admitting: Family Medicine

## 2016-05-03 DIAGNOSIS — R928 Other abnormal and inconclusive findings on diagnostic imaging of breast: Secondary | ICD-10-CM | POA: Diagnosis present

## 2016-05-17 ENCOUNTER — Telehealth: Payer: Self-pay | Admitting: Family Medicine

## 2016-05-17 NOTE — Telephone Encounter (Signed)
Order for cologuard faxed to [facility] °

## 2016-06-06 LAB — COLOGUARD: Cologuard: NEGATIVE

## 2016-09-29 ENCOUNTER — Other Ambulatory Visit: Payer: Self-pay | Admitting: Family Medicine

## 2017-04-06 IMAGING — CR DG KNEE COMPLETE 4+V*R*
1 series · 4 of 4 positions shown · non-contrast
Comparison: Plain films right knee 10/31/2006.

CLINICAL DATA: Right knee locking and pain for approximately 2
weeks. No known injury.

EXAM:
RIGHT KNEE - COMPLETE 4+ VIEW

[Series 1: dg knee complete 4 views right · 0.14mm/px · 4 of 4 slices shown]
[im 1/4]
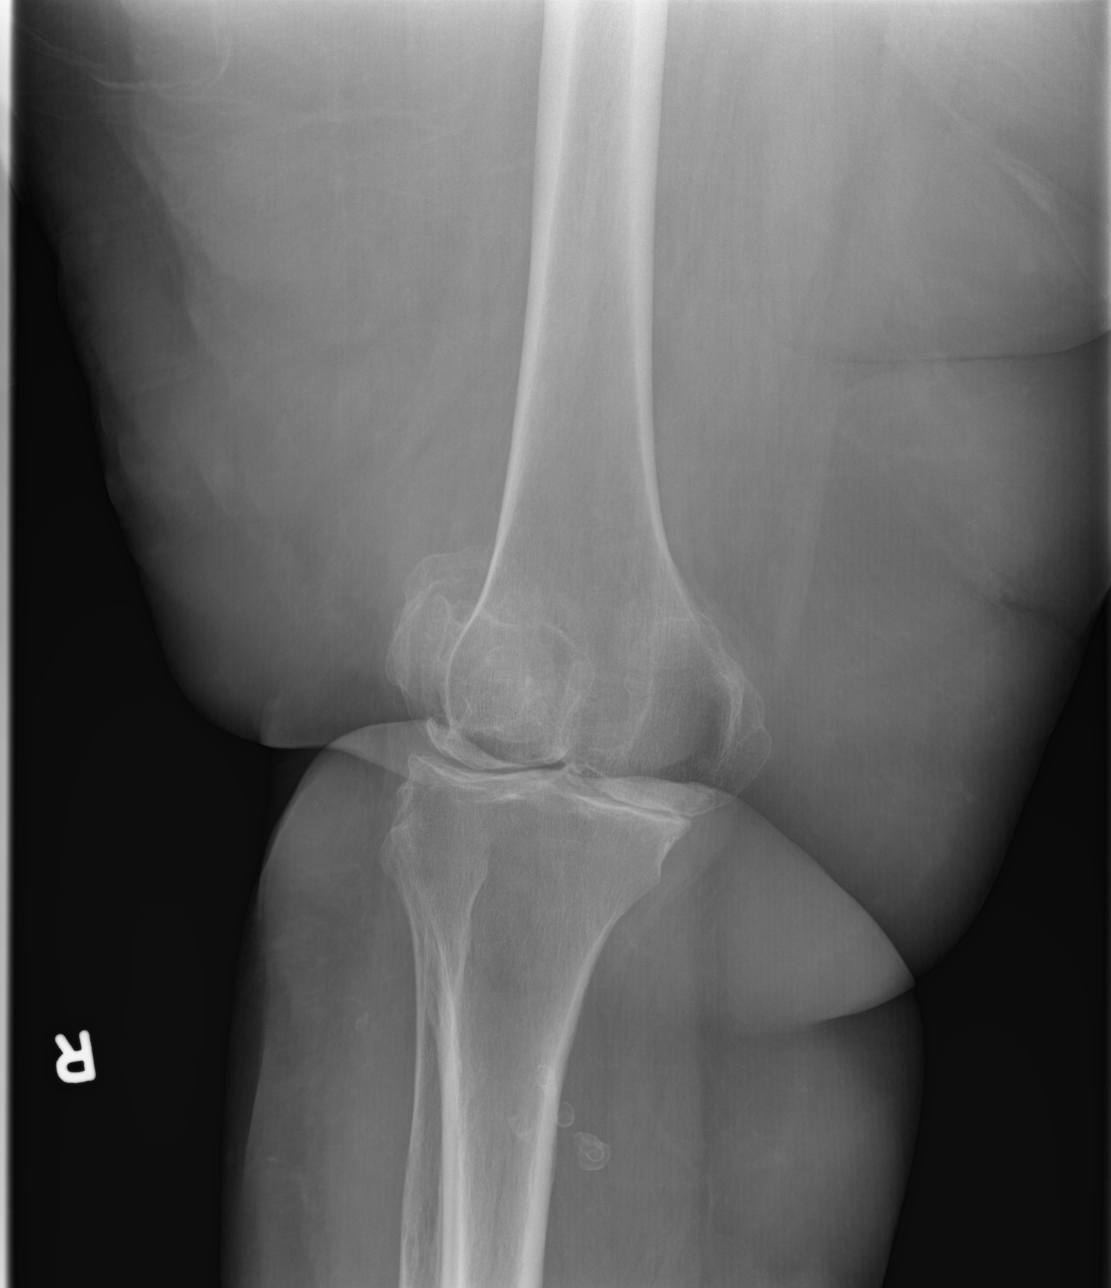
[im 2/4]
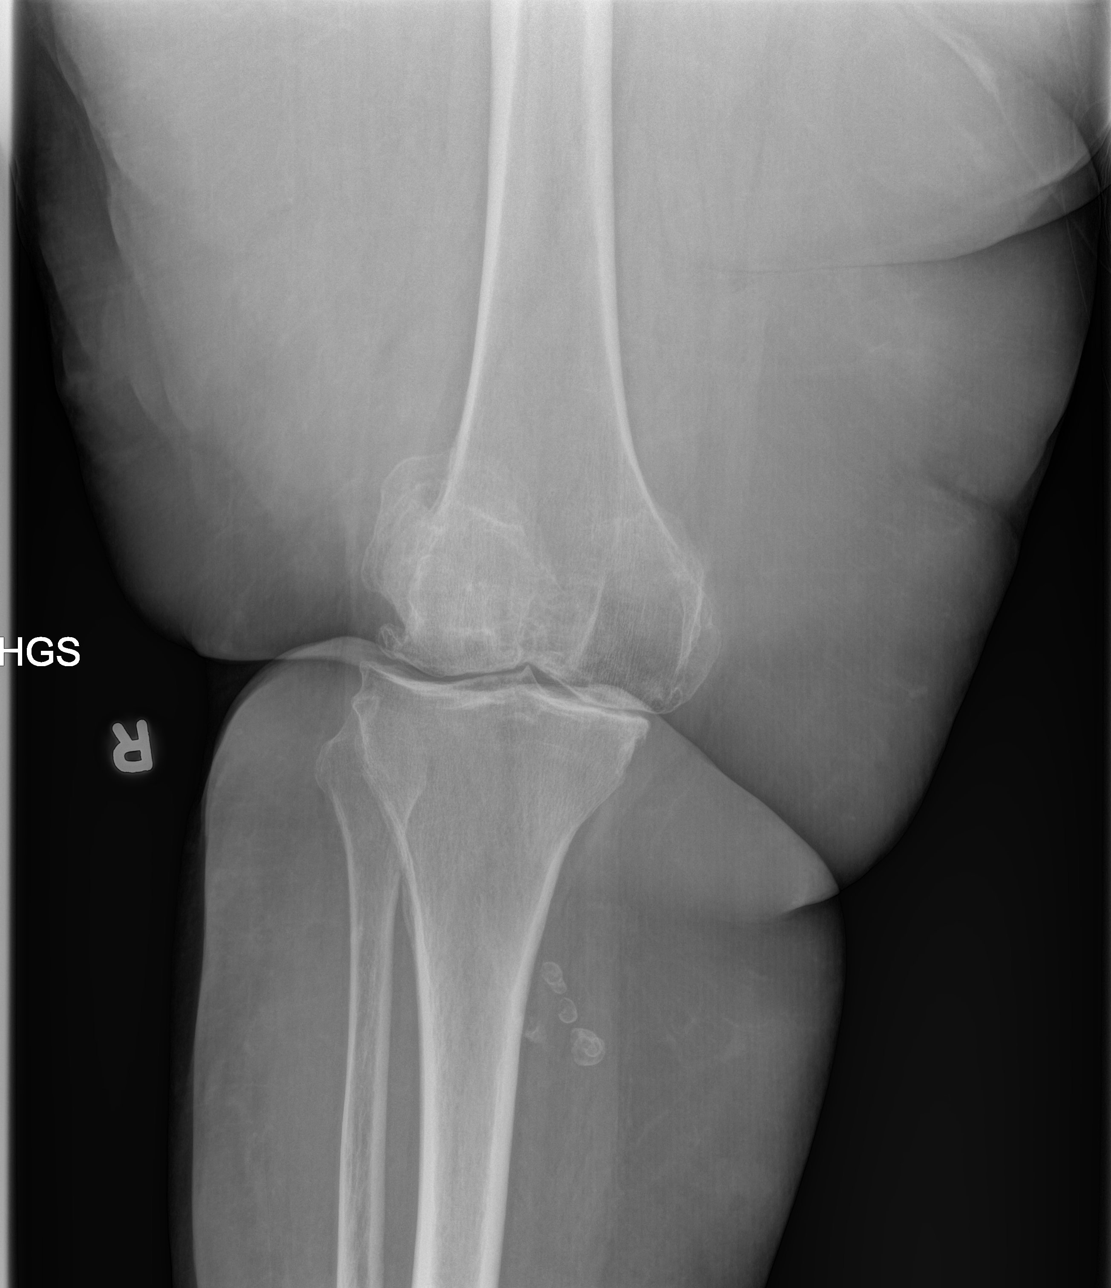
[im 3/4]
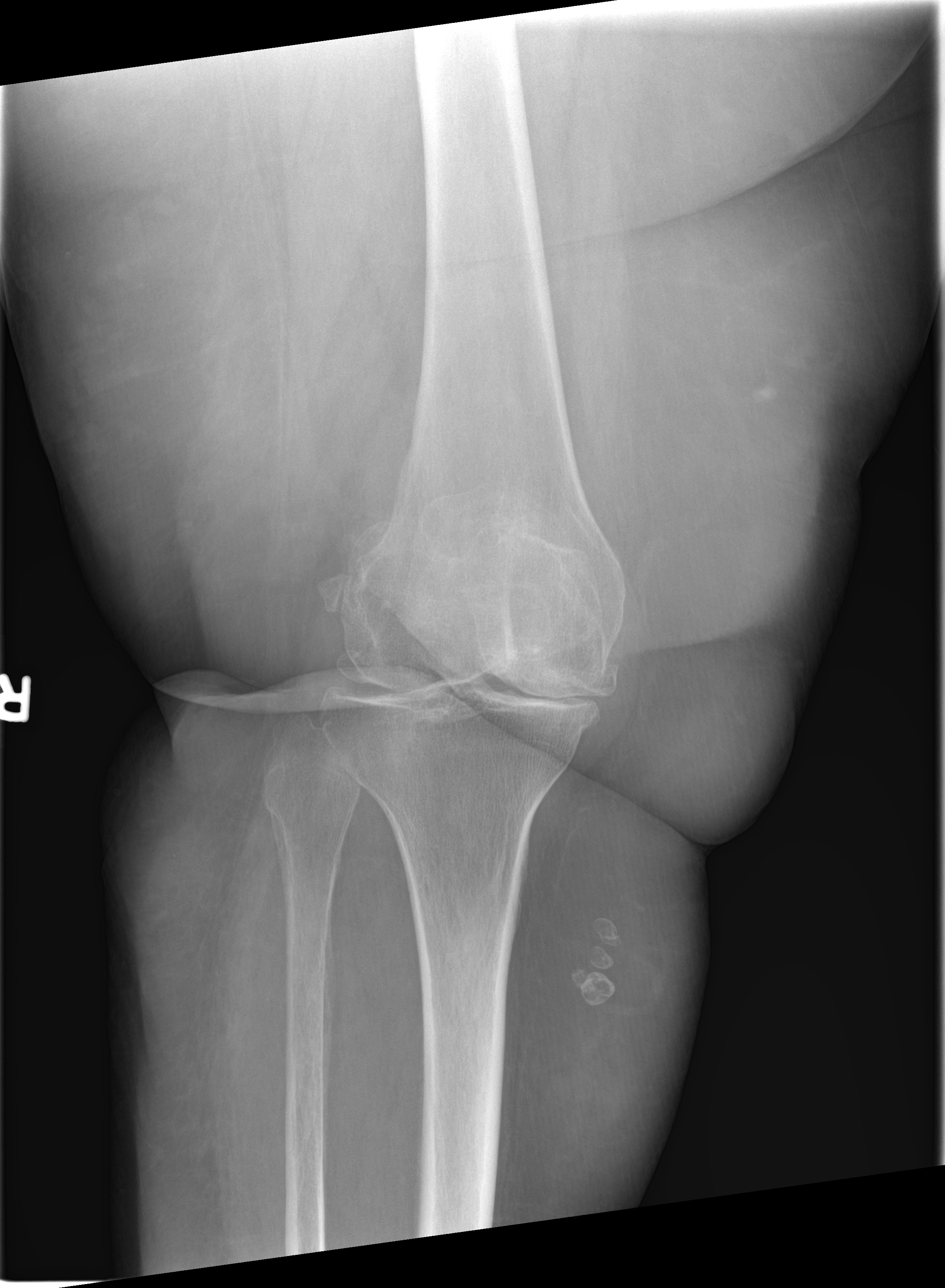
[im 4/4]
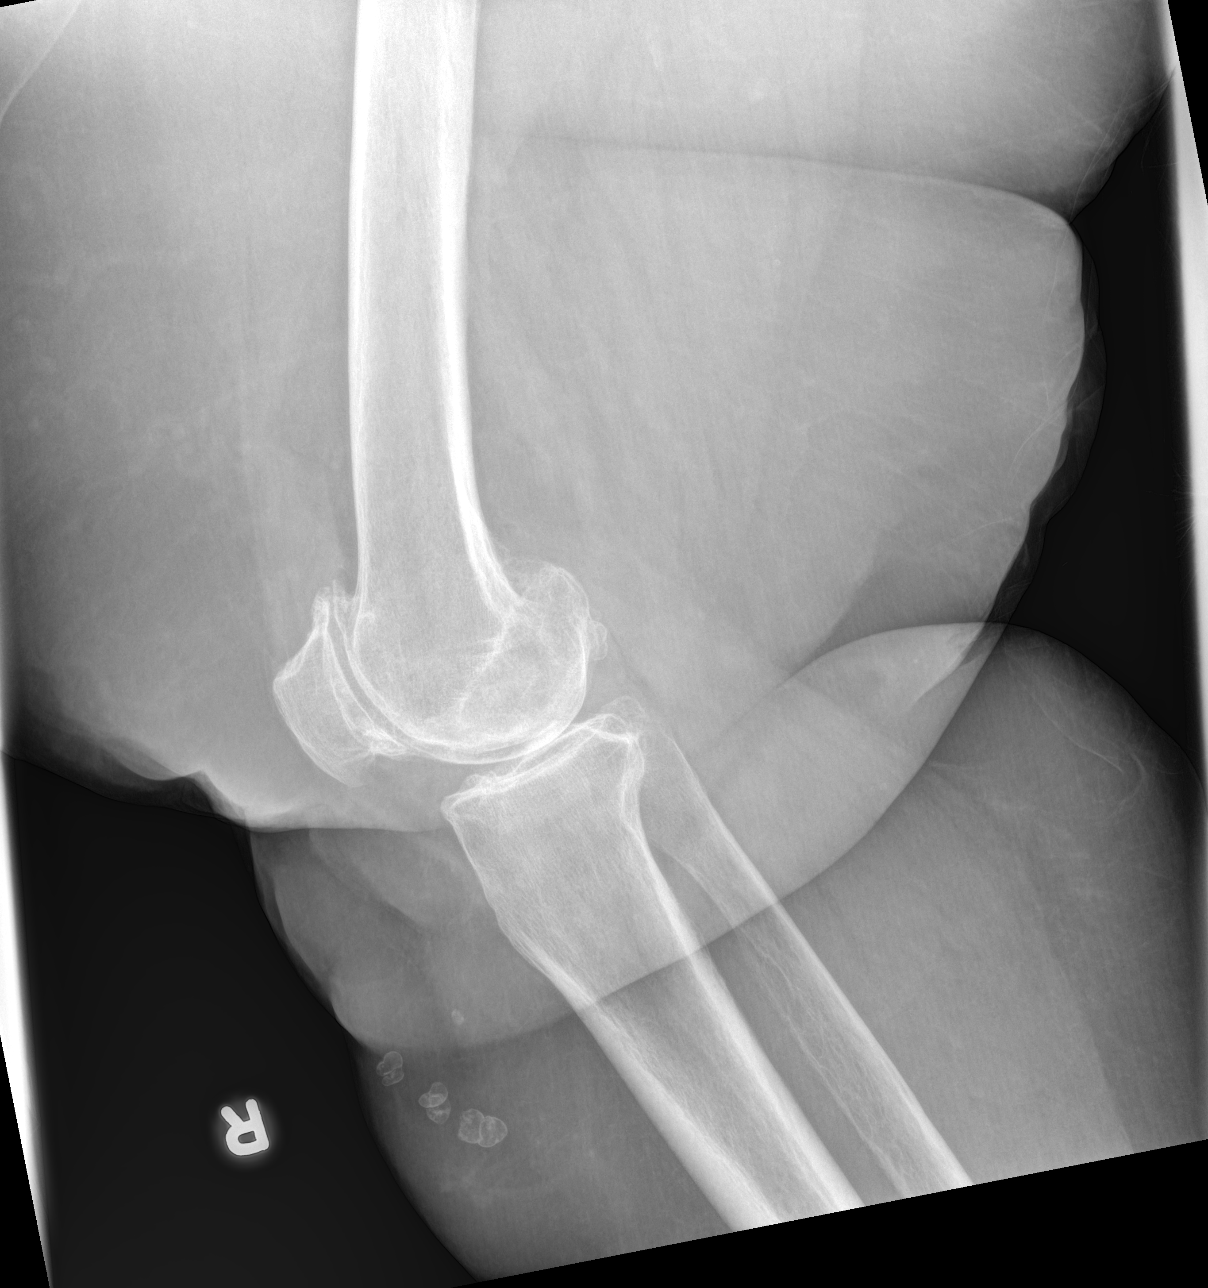

[4 of 4 positions shown; findings below may reference images not displayed]

FINDINGS: Severe tricompartmental osteoarthritis is again seen and is worst in
the medial compartment where there is bone-on-bone joint space
narrowing. Osteophytosis about the knee is increased. No fracture.
Subcutaneous calcifications in the anterior aspect of the lower leg
are likely due to some prior infectious or inflammatory process and
new since the prior exam. Small joint effusion noted.
IMPRESSION: No acute abnormality.

Severe tricompartmental osteoarthritis has progressed since [DATE].

## 2017-04-11 ENCOUNTER — Other Ambulatory Visit: Payer: Self-pay | Admitting: Family Medicine

## 2017-04-11 DIAGNOSIS — Z1231 Encounter for screening mammogram for malignant neoplasm of breast: Secondary | ICD-10-CM

## 2017-05-02 ENCOUNTER — Encounter: Payer: Self-pay | Admitting: Family Medicine

## 2017-05-02 ENCOUNTER — Ambulatory Visit (INDEPENDENT_AMBULATORY_CARE_PROVIDER_SITE_OTHER): Payer: 59 | Admitting: Family Medicine

## 2017-05-02 ENCOUNTER — Other Ambulatory Visit: Payer: Self-pay

## 2017-05-02 VITALS — BP 130/80 | HR 56 | Temp 98.0°F | Ht 63.0 in | Wt 292.0 lb

## 2017-05-02 DIAGNOSIS — M1711 Unilateral primary osteoarthritis, right knee: Secondary | ICD-10-CM | POA: Diagnosis not present

## 2017-05-02 DIAGNOSIS — Z90710 Acquired absence of both cervix and uterus: Secondary | ICD-10-CM | POA: Diagnosis not present

## 2017-05-02 DIAGNOSIS — E782 Mixed hyperlipidemia: Secondary | ICD-10-CM

## 2017-05-02 DIAGNOSIS — Z9884 Bariatric surgery status: Secondary | ICD-10-CM | POA: Diagnosis not present

## 2017-05-02 DIAGNOSIS — Z0000 Encounter for general adult medical examination without abnormal findings: Secondary | ICD-10-CM

## 2017-05-02 DIAGNOSIS — Z803 Family history of malignant neoplasm of breast: Secondary | ICD-10-CM

## 2017-05-02 LAB — POCT URINALYSIS DIPSTICK
Bilirubin, UA: NEGATIVE
Blood, UA: NEGATIVE
Glucose, UA: NEGATIVE
Ketones, UA: NEGATIVE
Leukocytes, UA: NEGATIVE
Nitrite, UA: NEGATIVE
Protein, UA: NEGATIVE
Spec Grav, UA: 1.015
Urobilinogen, UA: 0.2 U/dL
pH, UA: 6

## 2017-05-02 MED ORDER — MELOXICAM 15 MG PO TABS: 15.0000 mg | ORAL_TABLET | Freq: Every day | ORAL | 6 refills | Status: DC

## 2017-05-02 NOTE — Progress Notes (Signed)
Patient: Karen [name], Female    DOB: [birth year], 67 y.o.   MRN: [id number] Visit Date: 05/02/2017  Today's Provider: Vernie Murders, PA   Chief Complaint  Patient presents with  . Annual Exam   Subjective:    Annual physical exam Karen [name] is a 67 y.o. female who presents today for health maintenance and complete physical. She feels knee pain. She reports exercising maybe 30 minutes but causes knee pain. She reports she is sleeping well.  -----------------------------------------------------------------   Review of Systems  Constitutional: Negative.   HENT: Negative.   Eyes: Negative.   Respiratory: Negative.   Cardiovascular: Negative.   Gastrointestinal: Negative.   Genitourinary: Negative.   Musculoskeletal: Positive for arthralgias and back pain.       Low back ache early morning and knee pains.  Neurological: Positive for headaches.       Relieved by Excedrin and ice pack.  Psychiatric/Behavioral: Negative.     Social History      She  reports that she has never smoked. She has never used smokeless tobacco. She reports that she drinks about 0.6 oz of alcohol per week. She reports that she does not use drugs.       Social History   Socioeconomic History  . Marital status: Married    Spouse name: Not on file  . Number of children: Not on file  . Years of education: Not on file  . Highest education level: Not on file  Occupational History  . Not on file  Social Needs  . Financial resource strain: Not on file  . Food insecurity:    Worry: Not on file    Inability: Not on file  . Transportation needs:    Medical: Not on file    Non-medical: Not on file  Tobacco Use  . Smoking status: Never Smoker  . Smokeless tobacco: Never Used  Substance and Sexual Activity  . Alcohol use: Yes    Alcohol/week: 0.6 oz    Types: 1 Standard drinks or equivalent per week    Comment: rarely  . Drug use: No  . Sexual activity: Yes    Birth control/protection:  Post-menopausal  Lifestyle  . Physical activity:    Days per week: 0 days    Minutes per session: Not on file  . Stress: Not on file  Relationships  . Social connections:    Talks on phone: Not on file    Gets together: Not on file    Attends religious service: Not on file    Active member of club or organization: Not on file    Attends meetings of clubs or organizations: Not on file    Relationship status: Not on file  Other Topics Concern  . Not on file  Social History Narrative  . Not on file    Past Medical History:  Diagnosis Date  . Anemia   . Hyperlipidemia   . Vitamin D deficiency    Patient Active Problem List   Diagnosis Date Noted  . Anxiety state 04/13/2014  . Acute bronchitis 04/13/2014  . Bronchitis 04/13/2014  . Cerebrovascular accident, old 04/13/2014  . Chicken pox 04/13/2014  . Condyloma acuminatum 04/13/2014  . Screening for depression 04/13/2014  . Bariatric surgery status 04/13/2014  . Cerumen impaction 04/13/2014  . Received influenza vaccination at hospital 04/13/2014  . Screening for osteoporosis 04/13/2014  . Flu vaccine need 04/13/2014  . Pain in the wrist 04/13/2014  . Mucositis  oral 04/13/2014  . Avitaminosis D 04/13/2014  . Gravida 0 04/13/2014  . Screening breast examination 02/10/2009  . Family history of breast cancer 02/10/2009  . Absolute anemia 01/20/2009  . Extreme obesity 11/08/2008  . Acute inflammation of the pancreas 01/23/2008  . NEOPLASM UNCERTAIN BHV OTH&UNSPEC DIGESTIVE ORGN 08/30/2007  . Abdominal pain 08/23/2007  . CD (contact dermatitis) 03/09/2007  . Blood pressure elevated without history of HTN 03/09/2007  . Infection of the upper respiratory tract 11/02/2006  . Contusion of knee 10/31/2006  . Sprain of wrist 10/31/2006  . HLD (hyperlipidemia) 02/15/2006  . Routine general medical examination at a health care facility 02/08/2006  . Acute onset aura migraine 02/08/2006  . Benign neoplasm of skin of upper limb,  including shoulder 02/08/2006  . Hypercholesterolemia without hypertriglyceridemia 07/20/2005   Past Surgical History:  Procedure Laterality Date  . ABDOMINAL HYSTERECTOMY    . CHOLECYSTECTOMY    . CHOLECYSTECTOMY  1987  . GASTROPLASTY  1987  . HERNIA REPAIR     Family History        Family Status  Relation Name Status  . Mother  Deceased  . Father  Deceased  . Sister  Deceased  . Brother  Deceased  . PGF  Deceased  . Brother  Alive  . MGM  (Not Specified)  . Mat Aunt  (Not Specified)        Her family history includes Arthritis in her mother; Breast cancer in her maternal aunt and sister; Cancer in her brother, mother, paternal grandfather, and sister; Depression in her mother; Heart disease in her father and maternal grandmother; Hyperlipidemia in her brother; Hypertension in her mother; Stroke in her maternal grandmother.      Allergies  Allergen Reactions  . Tetanus Toxoid     Multiple joint pains.    Current Outpatient Medications:  .  aspirin-acetaminophen-caffeine (EXCEDRIN MIGRAINE) 250-250-65 MG tablet, Take by mouth as needed for headache., Disp: , Rfl:  .  b complex vitamins capsule, Take 2 tablets by mouth daily., Disp: , Rfl:  .  Calcium Carbonate (CALCIUM 600 PO), Take by mouth daily., Disp: , Rfl:  .  cholecalciferol (VITAMIN D) 1000 UNITS tablet, Take 1,000 Units by mouth every other day. 2000 units on all other days, Disp: , Rfl:  .  MULTIPLE VITAMIN PO, Take by mouth daily., Disp: , Rfl:  .  rosuvastatin (CRESTOR) 20 MG tablet, Take 20 mg by mouth daily., Disp: , Rfl:  .  docusate sodium (COLACE) 100 MG capsule, Take 1 tablet once or twice daily as needed for constipation while taking narcotic pain medicine (Patient not taking: Reported on 05/02/2017), Disp: 30 capsule, Rfl: 0 .  ondansetron (ZOFRAN ODT) 4 MG disintegrating tablet, Allow 1-2 tablets to dissolve in your mouth every 8 hours as needed for nausea/vomiting (Patient not taking: Reported on  05/02/2017), Disp: 30 tablet, Rfl: 0 .  SUMAtriptan (IMITREX) 50 MG tablet, Take 1 tablet by mouth as needed., Disp: , Rfl:  .  timolol (TIMOPTIC) 0.25 % ophthalmic solution, INSTILL ONE DROP IN LEFT EYE TWICE A DAY, Disp: , Rfl: 99   Patient Care Team: Tomeika Weinmann, Vickki Muff, PA as PCP - General (Physician Assistant)      Objective:   Vitals: BP 130/80   Pulse (!) 56   Temp 98 F (36.7 C) (Oral)   Ht 5\' 3"  (1.6 m)   Wt 292 lb (132.5 kg)   SpO2 96%   BMI 51.73 kg/m    Vitals:  05/02/17 0944  BP: 130/80  Pulse: (!) 56  Temp: 98 F (36.7 C)  TempSrc: Oral  SpO2: 96%  Weight: 292 lb (132.5 kg)  Height: 5\' 3"  (1.6 m)     Physical Exam  Constitutional: She is oriented to person, place, and time. She appears well-developed and well-nourished.  HENT:  Head: Normocephalic and atraumatic.  Right Ear: External ear normal.  Left Ear: External ear normal.  Nose: Nose normal.  Mouth/Throat: Oropharynx is clear and moist.  Eyes: Pupils are equal, round, and reactive to light. Conjunctivae and EOM are normal. Right eye exhibits no discharge.  Neck: Normal range of motion. Neck supple. No tracheal deviation present. No thyromegaly present.  Cardiovascular: Normal rate, regular rhythm, normal heart sounds and intact distal pulses.  No murmur heard. Pulmonary/Chest: Effort normal and breath sounds normal. No respiratory distress. She has no wheezes. She has no rales. She exhibits no tenderness.  Abdominal: Soft. Bowel sounds are normal. She exhibits no distension and no mass. There is no tenderness. There is no rebound and no guarding.  Morbid obesity status post bariatric surgery in 1987.  Musculoskeletal: Normal range of motion. She exhibits no edema or tenderness.  Lymphadenopathy:    She has no cervical adenopathy.  Neurological: She is alert and oriented to person, place, and time. She has normal reflexes. She displays normal reflexes. No cranial nerve deficit. She exhibits normal  muscle tone. Coordination normal.  Skin: Skin is warm and dry. No rash noted. No erythema.  Psychiatric: She has a normal mood and affect. Her behavior is normal. Judgment and thought content normal.     Depression Screen PHQ 2/9 Scores 05/02/2017 04/29/2016 03/20/2015  PHQ - 2 Score 0 0 0  PHQ- 9 Score 0 0 -    Assessment & Plan:     Routine Health Maintenance and Physical Exam  Exercise Activities and Dietary recommendations Goals    Need to get back to water aerobics 30-40 minutes 3-4 days a week and back to post bariatric surgery diet.      Immunization History  Administered Date(s) Administered  . Influenza Split 11/08/2008, 02/23/2011, 11/23/2011  . Influenza,inj,Quad PF,6+ Mos 11/08/2013, 10/24/2014, 10/15/2016  . Influenza-Unspecified 11/08/2013  . Zoster 03/20/2015    Health Maintenance  Topic Date Due  . PAP SMEAR  06/19/1973  . INFLUENZA VACCINE  08/11/2017  . MAMMOGRAM  04/20/2018  . TETANUS/TDAP  03/19/2025  . COLONOSCOPY  06/07/2026  . Hepatitis C Screening  Completed  . HIV Screening  Completed     Discussed health benefits of physical activity, and encouraged her to engage in regular exercise appropriate for her age and condition.    -------------------------------------------------------------------- 1. Annual physical exam Stable general health with severe obesity despite bariatric surgery with regaining lost weight. Allergic to tetanus. Cologard negative on 06-06-16. Urinalysis essentially normal today. Must get back on exercise program and lose weight. Given anticipatory guidance. Will check with her insurance regarding pneumonia vaccination coverage. Planning to sign up for Medicare soon. - POCT urinalysis dipstick  2. Bariatric surgery status Had gastric bypass in 1987 in [place]. Has regained majority of the initial weight loss and BMI over 51 today. Recheck CBC and CMP for metabolic disorders. Must get back on post bariatric surgery diet and  exercise program - CBC with Differential/Platelet - Comprehensive metabolic panel  3. Mixed hyperlipidemia Still taking the Lipitor 20 mg qd without side effects. Must get back on the exercise program and work on weight loss. Recheck CMP,  Lipid Panel and TSH. - Comprehensive metabolic panel - Lipid panel - TSH  4. Family history of breast cancer Has schedule for mammograms 05-04-17.   5. History of abdominal hysterectomy Had hysterectomy in 1992 due to prolapse of uterus. Had some Dyspareunia last year that was relieved by Premarin cream but could no afford to refill the prescription. No complaint this year.  6. Obesity, morbid, BMI 50 or higher (HCC) No significant change in weight over the past year. Has been "embarrassed" to restart exercise program. Encouraged to do water aerobics with her knee arthritis pain and get back on the post bariatric surgery diet. Check routine labs for further metabolic disorder. - CBC with Differential/Platelet - Comprehensive metabolic panel - TSH  7. Primary osteoarthritis of right knee Pain in the knees (R>L) with weight bearing. X-ray in Jan. 2018 showed tri-compartment osteoarthritis in the right knee. Secondary to severe obesity. Will refill Meloxicam she has used (initially was husband's prescription) for pain. Recheck labs. - CBC with Differential/Platelet - Comprehensive metabolic panel - meloxicam (MOBIC) 15 MG tablet; Take 1 tablet (15 mg total) by mouth daily.  Dispense: 30 tablet; Refill: [place], PA  [facility]

## 2017-05-03 LAB — CBC WITH DIFFERENTIAL/PLATELET
Basophils Absolute: 0 10*3/uL (ref 0.0–0.2)
Basos: 1 %
EOS (ABSOLUTE): 0.2 10*3/uL (ref 0.0–0.4)
Eos: 4 %
Hematocrit: 40.2 % (ref 34.0–46.6)
Hemoglobin: 12.9 g/dL (ref 11.1–15.9)
IMMATURE GRANULOCYTES: 0 %
Immature Grans (Abs): 0 10*3/uL (ref 0.0–0.1)
LYMPHS: 31 %
Lymphocytes Absolute: 1.4 10*3/uL (ref 0.7–3.1)
MCH: 32.4 pg (ref 26.6–33.0)
MCHC: 32.1 g/dL (ref 31.5–35.7)
MCV: 101 fL — ABNORMAL HIGH (ref 79–97)
MONOCYTES: 9 %
MONOS ABS: 0.4 10*3/uL (ref 0.1–0.9)
NEUTROS PCT: 55 %
Neutrophils Absolute: 2.5 10*3/uL (ref 1.4–7.0)
Platelets: 243 10*3/uL (ref 150–379)
RBC: 3.98 x10E6/uL (ref 3.77–5.28)
RDW: 12.5 % (ref 12.3–15.4)
WBC: 4.5 10*3/uL (ref 3.4–10.8)

## 2017-05-03 LAB — COMPREHENSIVE METABOLIC PANEL
ALT: 11 IU/L (ref 0–32)
AST: 21 IU/L (ref 0–40)
Albumin/Globulin Ratio: 1.5 (ref 1.2–2.2)
Albumin: 3.9 g/dL (ref 3.6–4.8)
Alkaline Phosphatase: 59 IU/L (ref 39–117)
BUN/Creatinine Ratio: 22 (ref 12–28)
BUN: 17 mg/dL (ref 8–27)
Bilirubin Total: 0.6 mg/dL (ref 0.0–1.2)
CALCIUM: 9.7 mg/dL (ref 8.7–10.3)
CO2: 24 mmol/L (ref 20–29)
CREATININE: 0.76 mg/dL (ref 0.57–1.00)
Chloride: 105 mmol/L (ref 96–106)
GFR calc Af Amer: 96 mL/min/{1.73_m2} (ref >59)
GFR, EST NON AFRICAN AMERICAN: 83 mL/min/{1.73_m2} (ref >59)
GLOBULIN, TOTAL: 2.6 g/dL (ref 1.5–4.5)
Glucose: 98 mg/dL (ref 65–99)
Potassium: 4.3 mmol/L (ref 3.5–5.2)
SODIUM: 142 mmol/L (ref 134–144)
Total Protein: 6.5 g/dL (ref 6.0–8.5)

## 2017-05-03 LAB — LIPID PANEL
CHOL/HDL RATIO: 3.7 ratio (ref 0.0–4.4)
Cholesterol, Total: 175 mg/dL (ref 100–199)
HDL: 47 mg/dL (ref >39)
LDL CALC: 98 mg/dL (ref 0–99)
TRIGLYCERIDES: 151 mg/dL — AB (ref 0–149)
VLDL CHOLESTEROL CAL: 30 mg/dL (ref 5–40)

## 2017-05-03 LAB — TSH: TSH: 1.5 u[IU]/mL (ref 0.450–4.500)

## 2017-05-04 ENCOUNTER — Ambulatory Visit
Admission: RE | Admit: 2017-05-04 | Discharge: 2017-05-04 | Disposition: A | Discharge status: Home / Self Care | Payer: 59 | Source: Ambulatory Visit | Attending: Family Medicine | Admitting: Family Medicine

## 2017-05-04 DIAGNOSIS — Z1231 Encounter for screening mammogram for malignant neoplasm of breast: Secondary | ICD-10-CM | POA: Insufficient documentation

## 2017-05-05 ENCOUNTER — Telehealth: Payer: Self-pay

## 2017-05-05 ENCOUNTER — Other Ambulatory Visit: Payer: Self-pay | Admitting: Family Medicine

## 2017-05-05 NOTE — Telephone Encounter (Signed)
Ask lab if Vitamin-D level can be done from blood drawn on 05-02-17 - HR:CBULAGT D Deficiency - E55.9. Prescription written for Prevnar-13.

## 2017-05-05 NOTE — Telephone Encounter (Signed)
Patient wanted to let Simona Huh know she contacted her insurance company to verify coverage for pneumonia vaccine that he recommended. Per patient [facility] states they will cover vaccine 100% at local pharmacy. Patient states [facility] requires [facility] for vaccine. She is requesting a RX be [facility] to [facility] in [place].  Patient also states [employer] will cover Vitamin D levels if patient needs them done. She is willing to have lab done if Simona Huh wants those results. She states that is up to him.  Please advise. CB#[phone]

## 2017-05-06 NOTE — Telephone Encounter (Signed)
RX faxed to [facility]. Lab add on faxed to [facility]. Patient advised if lab can not be added she will have to get blood redrawn.

## 2017-05-09 LAB — VITAMIN D 25 HYDROXY (VIT D DEFICIENCY, FRACTURES): Vit D, 25-Hydroxy: 32.4 ng/mL (ref 30.0–100.0)

## 2017-05-09 LAB — SPECIMEN STATUS REPORT

## 2017-06-13 ENCOUNTER — Other Ambulatory Visit: Payer: Self-pay | Admitting: Family Medicine

## 2017-10-06 ENCOUNTER — Other Ambulatory Visit: Payer: Self-pay | Admitting: Family Medicine

## 2017-10-06 ENCOUNTER — Telehealth: Payer: Self-pay | Admitting: Family Medicine

## 2017-10-06 DIAGNOSIS — E782 Mixed hyperlipidemia: Secondary | ICD-10-CM

## 2017-10-06 NOTE — Telephone Encounter (Signed)
Requesting if her lab work can be done a few days before her appt - Oct 24th at 10 am.

## 2017-10-06 NOTE — Telephone Encounter (Signed)
Pt advised.   Thanks,   -Emmerich Cryer  

## 2017-10-06 NOTE — Telephone Encounter (Signed)
Put future order in chart to be printed out when she comes by the office to get labs drawn.

## 2017-11-01 ENCOUNTER — Other Ambulatory Visit: Payer: Self-pay

## 2017-11-01 DIAGNOSIS — E782 Mixed hyperlipidemia: Secondary | ICD-10-CM

## 2017-11-02 LAB — COMPREHENSIVE METABOLIC PANEL
A/G RATIO: 1.3 (ref 1.2–2.2)
ALK PHOS: 51 IU/L (ref 39–117)
ALT: 13 IU/L (ref 0–32)
AST: 22 IU/L (ref 0–40)
Albumin: 3.7 g/dL (ref 3.6–4.8)
BILIRUBIN TOTAL: 0.6 mg/dL (ref 0.0–1.2)
BUN/Creatinine Ratio: 21 (ref 12–28)
BUN: 17 mg/dL (ref 8–27)
CO2: 24 mmol/L (ref 20–29)
Calcium: 9.6 mg/dL (ref 8.7–10.3)
Chloride: 105 mmol/L (ref 96–106)
Creatinine, Ser: 0.8 mg/dL (ref 0.57–1.00)
GFR calc non Af Amer: 78 mL/min/{1.73_m2} (ref >59)
GFR, EST AFRICAN AMERICAN: 89 mL/min/{1.73_m2} (ref >59)
GLUCOSE: 102 mg/dL — AB (ref 65–99)
Globulin, Total: 2.8 g/dL (ref 1.5–4.5)
POTASSIUM: 4.5 mmol/L (ref 3.5–5.2)
Sodium: 143 mmol/L (ref 134–144)
TOTAL PROTEIN: 6.5 g/dL (ref 6.0–8.5)

## 2017-11-02 LAB — CBC WITH DIFFERENTIAL/PLATELET
Basophils Absolute: 0 x10E3/uL (ref 0.0–0.2)
Basos: 1 %
EOS (ABSOLUTE): 0.3 x10E3/uL (ref 0.0–0.4)
Eos: 6 %
Hematocrit: 38.4 % (ref 34.0–46.6)
Hemoglobin: 12.3 g/dL (ref 11.1–15.9)
Immature Grans (Abs): 0 x10E3/uL (ref 0.0–0.1)
Immature Granulocytes: 0 %
Lymphocytes Absolute: 1.5 x10E3/uL (ref 0.7–3.1)
Lymphs: 33 %
MCH: 32 pg (ref 26.6–33.0)
MCHC: 32 g/dL (ref 31.5–35.7)
MCV: 100 fL — ABNORMAL HIGH (ref 79–97)
Monocytes Absolute: 0.5 x10E3/uL (ref 0.1–0.9)
Monocytes: 10 %
Neutrophils Absolute: 2.3 x10E3/uL (ref 1.4–7.0)
Neutrophils: 50 %
Platelets: 216 x10E3/uL (ref 150–450)
RBC: 3.84 x10E6/uL (ref 3.77–5.28)
RDW: 11.7 % — ABNORMAL LOW (ref 12.3–15.4)
WBC: 4.6 x10E3/uL (ref 3.4–10.8)

## 2017-11-02 LAB — LIPID PANEL
Chol/HDL Ratio: 3.8 ratio (ref 0.0–4.4)
Cholesterol, Total: 157 mg/dL (ref 100–199)
HDL: 41 mg/dL (ref >39)
LDL Calculated: 85 mg/dL (ref 0–99)
Triglycerides: 153 mg/dL — ABNORMAL HIGH (ref 0–149)
VLDL CHOLESTEROL CAL: 31 mg/dL (ref 5–40)

## 2017-11-03 ENCOUNTER — Other Ambulatory Visit: Payer: Self-pay

## 2017-11-03 ENCOUNTER — Encounter: Payer: Self-pay | Admitting: Family Medicine

## 2017-11-03 ENCOUNTER — Ambulatory Visit (INDEPENDENT_AMBULATORY_CARE_PROVIDER_SITE_OTHER): Payer: PPO | Admitting: Family Medicine

## 2017-11-03 VITALS — BP 140/98 | HR 61 | Temp 97.4°F | Ht 62.5 in | Wt 298.2 lb

## 2017-11-03 DIAGNOSIS — Z23 Encounter for immunization: Secondary | ICD-10-CM | POA: Diagnosis not present

## 2017-11-03 DIAGNOSIS — E668 Other obesity: Secondary | ICD-10-CM

## 2017-11-03 DIAGNOSIS — E782 Mixed hyperlipidemia: Secondary | ICD-10-CM | POA: Diagnosis not present

## 2017-11-03 NOTE — Progress Notes (Signed)
Patient: Karen [name] Female    DOB: 1950/07/02   67 y.o.   MRN: [id number] Visit Date: 11/03/2017  Today's Provider: Vernie Murders, PA   Chief Complaint  Patient presents with  . Follow-up    6 month labs   Subjective:    HPI pt reports that she is here for a 6 month follow up and to go over labs that she had on Tuesday 11/01/17.  Pt also would like to get the flu vaccine and the shingles vaccine.  Pt will need to check with her insurance about shingles vaccine.     Past Medical History:  Diagnosis Date  . Anemia   . Hyperlipidemia   . Vitamin D deficiency    Past Surgical History:  Procedure Laterality Date  . ABDOMINAL HYSTERECTOMY    . CHOLECYSTECTOMY    . CHOLECYSTECTOMY  1987  . GASTROPLASTY  1987  . HERNIA REPAIR     Family History  Problem Relation Age of Onset  . Hypertension Mother   . Cancer Mother   . Arthritis Mother   . Depression Mother   . Heart disease Father   . Cancer Sister   . Breast cancer Sister   . Cancer Brother   . Cancer Paternal Grandfather   . Hyperlipidemia Brother   . Heart disease Maternal Grandmother   . Stroke Maternal Grandmother   . Breast cancer Maternal Aunt    Allergies  Allergen Reactions  . Tetanus Toxoid     Multiple joint pains.    Current Outpatient Medications:  .  aspirin-acetaminophen-caffeine (EXCEDRIN MIGRAINE) 250-250-65 MG tablet, Take by mouth as needed for headache., Disp: , Rfl:  .  atorvastatin (LIPITOR) 20 MG tablet, TAKE 1 TABLET (20 MG TOTAL) BY MOUTH DAILY., Disp: 90 tablet, Rfl: 2 .  b complex vitamins capsule, Take 2 tablets by mouth daily., Disp: , Rfl:  .  cholecalciferol (VITAMIN D) 1000 UNITS tablet, Take 1,000 Units by mouth every other day. 2000 units on all other days, Disp: , Rfl:  .  meloxicam (MOBIC) 15 MG tablet, Take 1 tablet (15 mg total) by mouth daily., Disp: 30 tablet, Rfl: 6 .  MULTIPLE VITAMIN PO, Take by mouth daily., Disp: , Rfl:  .  SUMAtriptan (IMITREX) 50 MG  tablet, Take 1 tablet by mouth as needed., Disp: , Rfl:  .  timolol (TIMOPTIC) 0.25 % ophthalmic solution, Place into both eyes. , Disp: , Rfl: 99 .  Calcium Carbonate (CALCIUM 600 PO), Take by mouth daily., Disp: , Rfl:  .  docusate sodium (COLACE) 100 MG capsule, Take 1 tablet once or twice daily as needed for constipation while taking narcotic pain medicine (Patient not taking: Reported on 11/03/2017), Disp: 30 capsule, Rfl: 0 .  ondansetron (ZOFRAN ODT) 4 MG disintegrating tablet, Allow 1-2 tablets to dissolve in your mouth every 8 hours as needed for nausea/vomiting (Patient not taking: Reported on 05/02/2017), Disp: 30 tablet, Rfl: 0  Review of Systems  Constitutional: Negative.   HENT: Negative.   Eyes: Negative.   Respiratory: Negative.   Cardiovascular: Negative.   Gastrointestinal: Negative.   Endocrine: Negative.   Genitourinary: Negative.   Musculoskeletal: Negative.   Skin: Negative.   Allergic/Immunologic: Negative.   Neurological: Negative.   Hematological: Negative.   Psychiatric/Behavioral: Negative.    Social History   Tobacco Use  . Smoking status: Never Smoker  . Smokeless tobacco: Never Used  Substance Use Topics  . Alcohol use: Yes  Alcohol/week: 1.0 standard drinks    Types: 1 Standard drinks or equivalent per week    Comment: rarely   Objective:   BP (!) 140/98 (BP Location: Right Arm, Patient Position: Sitting, Cuff Size: Large)   Pulse 61   Temp (!) 97.4 F (36.3 C) (Oral)   Ht 5' 2.5" (1.588 m)   Wt 298 lb 3.2 oz (135.3 kg)   SpO2 98%   BMI 53.67 kg/m   BP Readings from Last 3 Encounters:  11/03/17 (!) 140/98  05/02/17 130/80  04/29/16 140/88    Vitals:   11/03/17 1013  BP: (!) 140/98  Pulse: 61  Temp: (!) 97.4 F (36.3 C)  TempSrc: Oral  SpO2: 98%  Weight: 298 lb 3.2 oz (135.3 kg)  Height: 5' 2.5" (1.588 m)   Physical Exam  Constitutional: She is oriented to person, place, and time. She appears well-developed and  well-nourished. No distress.  HENT:  Head: Normocephalic and atraumatic.  Right Ear: Hearing normal.  Left Ear: Hearing normal.  Nose: Nose normal.  Eyes: Conjunctivae and lids are normal. Right eye exhibits no discharge. Left eye exhibits no discharge. No scleral icterus.  Neck: Neck supple.  Cardiovascular: Normal rate and regular rhythm.  Pulmonary/Chest: Effort normal and breath sounds normal. No respiratory distress.  Abdominal: Soft. Bowel sounds are normal.  Severe obesity unchanged.  Musculoskeletal: Normal range of motion.  Neurological: She is alert and oriented to person, place, and time.  Skin: Skin is intact. No lesion and no rash noted.  Psychiatric: She has a normal mood and affect. Her speech is normal and behavior is normal. Thought content normal.      Assessment & Plan:     1. Mixed hyperlipidemia Still taking Lipitor 20 mg qd. Recheck labs showed triglycerides in the 150+ range but cholesterol normal. Work on exercise and low fat diet. Recheck at [facility] in 6 months. Lipid Panel     Component Value Date/Time   CHOL 157 11/01/2017 1135   TRIG 153 (H) 11/01/2017 1135   HDL 41 11/01/2017 1135   CHOLHDL 3.8 11/01/2017 1135   LDLCALC 85 11/01/2017 1135   2. Extreme obesity BMI still over 50 the past year. Some persistent increase in MCV on CBC and triglycerides 153. States she is still taking her vitamins as recommended by bariatric surgeon that did her gastric bypass in [place] in 1987. Reminded her to get back on exercise program and follow low fat bariatric diet.  CBC Latest Ref Rng & Units 11/01/2017 05/02/2017 04/29/2016  WBC 3.4 - 10.8 x10E3/uL 4.6 4.5 4.5  Hemoglobin 11.1 - 15.9 g/dL 12.3 12.9 12.6  Hematocrit 34.0 - 46.6 % 38.4 40.2 38.9  Platelets 150 - 450 x10E3/uL 216 243 232   CMP Latest Ref Rng & Units 11/01/2017 05/02/2017 04/29/2016  Glucose 65 - 99 mg/dL 102(H) 98 105(H)  BUN 8 - 27 mg/dL 17 17 15   Creatinine 0.57 - 1.00 mg/dL 0.80 0.76 0.81  Sodium  134 - 144 mmol/L 143 142 142  Potassium 3.5 - 5.2 mmol/L 4.5 4.3 4.3  Chloride 96 - 106 mmol/L 105 105 102  CO2 20 - 29 mmol/L 24 24 25   Calcium 8.7 - 10.3 mg/dL 9.6 9.7 10.2  Total Protein 6.0 - 8.5 g/dL 6.5 6.5 6.6  Total Bilirubin 0.0 - 1.2 mg/dL 0.6 0.6 0.7  Alkaline Phos 39 - 117 IU/L 51 59 62  AST 0 - 40 IU/L 22 21 19   ALT 0 - 32 IU/L 13 11  13   3. Needs flu shot - Flu vaccine HIGH DOSE PF (Fluzone High dose)  4. Need for Streptococcus pneumoniae vaccination - Pneumococcal conjugate vaccine 13-valent IM       Vernie Murders, PA  [facility]

## 2017-11-25 ENCOUNTER — Other Ambulatory Visit: Payer: Self-pay | Admitting: Family Medicine

## 2017-11-25 DIAGNOSIS — M1711 Unilateral primary osteoarthritis, right knee: Secondary | ICD-10-CM

## 2017-12-06 DIAGNOSIS — H40019 Open angle with borderline findings, low risk, unspecified eye: Secondary | ICD-10-CM | POA: Diagnosis not present

## 2018-03-10 ENCOUNTER — Other Ambulatory Visit: Payer: Self-pay | Admitting: Family Medicine

## 2018-03-27 ENCOUNTER — Telehealth: Payer: Self-pay | Admitting: Family Medicine

## 2018-03-27 ENCOUNTER — Other Ambulatory Visit: Payer: Self-pay | Admitting: Family Medicine

## 2018-03-27 DIAGNOSIS — Z1231 Encounter for screening mammogram for malignant neoplasm of breast: Secondary | ICD-10-CM

## 2018-03-27 NOTE — Telephone Encounter (Signed)
Will need to check for strep, flu or corona causing sore throat. Schedule appointment.

## 2018-03-27 NOTE — Telephone Encounter (Signed)
Patient was advised. Patient stated she is busy the next few days but if symptoms worsen she will call for ov.

## 2018-03-27 NOTE — Telephone Encounter (Signed)
Please advise 

## 2018-03-27 NOTE — Telephone Encounter (Signed)
Pt needing to know if Simona Huh will call her in an antibiotic for tonsillitis symptoms. She doesn't want to come in due to the Coronavirus.  No fever Congested Spots on back of throat  Please call into: [id number] - [place], [place] - [address] [phone] (Phone) [phone] (Fax)   Please advise.  Thanks, [employer]

## 2018-06-06 ENCOUNTER — Other Ambulatory Visit: Payer: Self-pay | Admitting: Family Medicine

## 2018-06-23 ENCOUNTER — Other Ambulatory Visit: Payer: Self-pay

## 2018-06-23 ENCOUNTER — Ambulatory Visit (INDEPENDENT_AMBULATORY_CARE_PROVIDER_SITE_OTHER): Payer: PPO | Admitting: Family Medicine

## 2018-06-23 VITALS — BP 134/88 | HR 62 | Temp 98.2°F | Resp 18 | Wt 303.8 lb

## 2018-06-23 DIAGNOSIS — Z90710 Acquired absence of both cervix and uterus: Secondary | ICD-10-CM

## 2018-06-23 DIAGNOSIS — Z0000 Encounter for general adult medical examination without abnormal findings: Secondary | ICD-10-CM | POA: Diagnosis not present

## 2018-06-23 DIAGNOSIS — M1711 Unilateral primary osteoarthritis, right knee: Secondary | ICD-10-CM | POA: Diagnosis not present

## 2018-06-23 DIAGNOSIS — E782 Mixed hyperlipidemia: Secondary | ICD-10-CM

## 2018-06-23 NOTE — Progress Notes (Signed)
Patient: Karen [name], Female    DOB: 27-Sep-1950, 68 y.o.   MRN: [id number] Visit Date: 06/23/2018  Today's Provider: Vernie Murders, PA   Chief Complaint  Patient presents with  . Annual Exam   Subjective:     Annual physical exam Karen [name] is a 68 y.o. female who presents today for health maintenance and complete physical. She feels well.  She reports exercising daily. She reports she is sleeping well.  -----------------------------------------------------------------   Review of Systems  All other systems reviewed and are negative.   Social History      She  reports that she has never smoked. She has never used smokeless tobacco. She reports current alcohol use of about 1.0 standard drinks of alcohol per week. She reports that she does not use drugs.       Social History   Socioeconomic History  . Marital status: Married    Spouse name: Not on file  . Number of children: Not on file  . Years of education: Not on file  . Highest education level: Not on file  Occupational History  . Not on file  Social Needs  . Financial resource strain: Not on file  . Food insecurity    Worry: Not on file    Inability: Not on file  . Transportation needs    Medical: Not on file    Non-medical: Not on file  Tobacco Use  . Smoking status: Never Smoker  . Smokeless tobacco: Never Used  Substance and Sexual Activity  . Alcohol use: Yes    Alcohol/week: 1.0 standard drinks    Types: 1 Standard drinks or equivalent per week    Comment: rarely  . Drug use: No  . Sexual activity: Yes    Birth control/protection: Post-menopausal  Lifestyle  . Physical activity    Days per week: 0 days    Minutes per session: Not on file  . Stress: Not on file  Relationships  . Social Herbalist on phone: Not on file    Gets together: Not on file    Attends religious service: Not on file    Active member of club or organization: Not on file    Attends meetings of clubs  or organizations: Not on file    Relationship status: Not on file  Other Topics Concern  . Not on file  Social History Narrative  . Not on file   Past Medical History:  Diagnosis Date  . Anemia   . Hyperlipidemia   . Vitamin D deficiency    Patient Active Problem List   Diagnosis Date Noted  . Anxiety state 04/13/2014  . Acute bronchitis 04/13/2014  . Bronchitis 04/13/2014  . Cerebrovascular accident, old 04/13/2014  . Chicken pox 04/13/2014  . Condyloma acuminatum 04/13/2014  . Screening for depression 04/13/2014  . Bariatric surgery status 04/13/2014  . Cerumen impaction 04/13/2014  . Received influenza vaccination at hospital 04/13/2014  . Screening for osteoporosis 04/13/2014  . Flu vaccine need 04/13/2014  . Pain in the wrist 04/13/2014  . Mucositis oral 04/13/2014  . Avitaminosis D 04/13/2014  . Gravida 0 04/13/2014  . Screening breast examination 02/10/2009  . Family history of breast cancer 02/10/2009  . Absolute anemia 01/20/2009  . Extreme obesity 11/08/2008  . Acute inflammation of the pancreas 01/23/2008  . NEOPLASM UNCERTAIN BHV OTH&UNSPEC DIGESTIVE ORGN 08/30/2007  . Abdominal pain 08/23/2007  . CD (contact dermatitis) 03/09/2007  . Blood  pressure elevated without history of HTN 03/09/2007  . Infection of the upper respiratory tract 11/02/2006  . Contusion of knee 10/31/2006  . Sprain of wrist 10/31/2006  . HLD (hyperlipidemia) 02/15/2006  . Routine general medical examination at a health care facility 02/08/2006  . Acute onset aura migraine 02/08/2006  . Benign neoplasm of skin of upper limb, including shoulder 02/08/2006  . Hypercholesterolemia without hypertriglyceridemia 07/20/2005   Past Surgical History:  Procedure Laterality Date  . ABDOMINAL HYSTERECTOMY    . CHOLECYSTECTOMY    . CHOLECYSTECTOMY  1987  . GASTROPLASTY  1987  . HERNIA REPAIR     Family History        Family Status  Relation Name Status  . Mother  Deceased  . Father   Deceased  . Sister  Deceased  . Brother  Deceased  . PGF  Deceased  . Brother  Alive  . MGM  (Not Specified)  . Mat Aunt  (Not Specified)        Her family history includes Arthritis in her mother; Breast cancer in her maternal aunt and sister; Cancer in her brother, mother, paternal grandfather, and sister; Depression in her mother; Heart disease in her father and maternal grandmother; Hyperlipidemia in her brother; Hypertension in her mother; Stroke in her maternal grandmother.     Allergies  Allergen Reactions  . Tetanus Toxoid     Multiple joint pains.    Current Outpatient Medications:  .  aspirin-acetaminophen-caffeine (EXCEDRIN MIGRAINE) 250-250-65 MG tablet, Take by mouth as needed for headache., Disp: , Rfl:  .  atorvastatin (LIPITOR) 20 MG tablet, TAKE 1 TABLET BY MOUTH EVERY DAY, Disp: 90 tablet, Rfl: 0 .  b complex vitamins capsule, Take 2 tablets by mouth daily., Disp: , Rfl:  .  Calcium Carbonate (CALCIUM 600 PO), Take by mouth daily., Disp: , Rfl:  .  cholecalciferol (VITAMIN D) 1000 UNITS tablet, Take 1,000 Units by mouth every other day. 2000 units on all other days, Disp: , Rfl:  .  meloxicam (MOBIC) 15 MG tablet, TAKE 1 TABLET BY MOUTH EVERY DAY, Disp: 90 tablet, Rfl: 2 .  MULTIPLE VITAMIN PO, Take by mouth daily., Disp: , Rfl:  .  Omega-3 Fatty Acids (FISH OIL) 1000 MG CAPS, Take by mouth., Disp: , Rfl:  .  timolol (TIMOPTIC) 0.25 % ophthalmic solution, Place into both eyes. , Disp: , Rfl: 99 .  docusate sodium (COLACE) 100 MG capsule, Take 1 tablet once or twice daily as needed for constipation while taking narcotic pain medicine (Patient not taking: Reported on 11/03/2017), Disp: 30 capsule, Rfl: 0 .  ondansetron (ZOFRAN ODT) 4 MG disintegrating tablet, Allow 1-2 tablets to dissolve in your mouth every 8 hours as needed for nausea/vomiting (Patient not taking: Reported on 05/02/2017), Disp: 30 tablet, Rfl: 0 .  SUMAtriptan (IMITREX) 50 MG tablet, Take 1 tablet by  mouth as needed., Disp: , Rfl:    Patient Care Team: Chrismon, Vickki Muff, PA as PCP - General (Physician Assistant)    Objective:    Vitals: BP 134/88 (BP Location: Right Arm, Patient Position: Sitting, Cuff Size: Large)   Pulse 62   Temp 98.2 F (36.8 C) (Oral)   Resp 18   Wt (!) 303 lb 12.8 oz (137.8 kg)   SpO2 97%   BMI 54.68 kg/m    Wt Readings from Last 3 Encounters:  06/23/18 (!) 303 lb 12.8 oz (137.8 kg)  11/03/17 298 lb 3.2 oz (135.3 kg)  05/02/17 292 lb (  132.5 kg)   Vitals:   06/23/18 1039  BP: 134/88  Pulse: 62  Resp: 18  Temp: 98.2 F (36.8 C)  TempSrc: Oral  SpO2: 97%  Weight: (!) 303 lb 12.8 oz (137.8 kg)    Physical Exam Constitutional:      Appearance: She is well-developed.  HENT:     Head: Normocephalic and atraumatic.     Right Ear: External ear normal.     Left Ear: External ear normal.     Nose: Nose normal.  Eyes:     General:        Right eye: No discharge.     Conjunctiva/sclera: Conjunctivae normal.     Pupils: Pupils are equal, round, and reactive to light.  Neck:     Musculoskeletal: Normal range of motion and neck supple.     Thyroid: No thyromegaly.     Trachea: No tracheal deviation.  Cardiovascular:     Rate and Rhythm: Normal rate and regular rhythm.     Heart sounds: Normal heart sounds. No murmur.  Pulmonary:     Effort: Pulmonary effort is normal. No respiratory distress.     Breath sounds: Normal breath sounds. No wheezing or rales.  Chest:     Chest wall: No tenderness.  Abdominal:     General: There is no distension.     Palpations: Abdomen is soft. There is no mass.     Tenderness: There is no abdominal tenderness. There is no guarding or rebound.  Musculoskeletal: Normal range of motion.        General: No tenderness.  Lymphadenopathy:     Cervical: No cervical adenopathy.  Skin:    General: Skin is warm and dry.     Findings: No erythema or rash.  Neurological:     Mental Status: She is alert and oriented to  person, place, and time.     Cranial Nerves: No cranial nerve deficit.     Motor: No abnormal muscle tone.     Coordination: Coordination normal.     Deep Tendon Reflexes: Reflexes are normal and symmetric. Reflexes normal.  Psychiatric:        Behavior: Behavior normal.        Thought Content: Thought content normal.        Judgment: Judgment normal.     Depression Screen PHQ 2/9 Scores 06/23/2018 11/03/2017 05/02/2017 04/29/2016  PHQ - 2 Score 0 0 0 0  PHQ- 9 Score 0 - 0 0     Assessment & Plan:     Routine Health Maintenance and Physical Exam  Exercise Activities and Dietary recommendations Goals   Doing sitting exercise 2-3 times a day (total of 30 minutes). No longer walking much due to pandemic. Must reduce caloric intake to work on weight loss until able to get more aerobic exercise.     Immunization History  Administered Date(s) Administered  . Influenza Split 11/08/2008, 02/23/2011, 11/23/2011  . Influenza, High Dose Seasonal PF 11/03/2017  . Influenza,inj,Quad PF,6+ Mos 11/08/2013, 10/24/2014, 10/15/2016  . Influenza-Unspecified 11/08/2013  . Pneumococcal Conjugate-13 05/10/2017, 11/03/2017  . Zoster 03/20/2015    Health Maintenance  Topic Date Due  . MAMMOGRAM  05/05/2018  . INFLUENZA VACCINE  08/12/2018  . PNA vac Low Risk Adult (2 of 2 - PPSV23) 11/04/2018  . TETANUS/TDAP  03/19/2025  . COLONOSCOPY  06/07/2026  . DEXA SCAN  Completed  . Hepatitis C Screening  Completed    Discussed health benefits of physical activity, and encouraged  her to engage in regular exercise appropriate for her age and condition.    -------------------------------------------------------------------- 1. Annual physical exam General health stable. Severely obese which worsens osteoarthritis. Immunizations up to date. Last Cologuard on 06-06-16 was negative. Due for repeat next year. Given anticipatory counseling and recheck CBC, CMP, Lipid Panel and TSH. - CBC with  Differential/Platelet - Comprehensive metabolic panel - Lipid panel - TSH  2. Obesity, morbid, BMI 50 or higher (HCC) Severe morbid obesity despite past bariatric surgery in 1987. Encouraged to work on diet and weight loss program. Exercise as joints will allow. Recheck routine metabolic labs. - CBC with Differential/Platelet - Comprehensive metabolic panel - Lipid panel - TSH  3. Mixed hyperlipidemia Still tolerating the Omega-3 Fish Oil 1000 mg qd and Atorvastatin 20 mg qd. Recheck CMP and Lipid Panel to assess progress. Must work on low fat diet and lose weight. - Comprehensive metabolic panel - Lipid panel  4. Primary osteoarthritis of right knee Intermittent flare. Due to severe obesity. Recheck routine labs and may use Mobic 15 mg qd.  - CBC with Differential/Platelet  5. History of abdominal hysterectomy Abdominal hysterectomy in 1992 due to prolapsed uterus. Well healed.    Vernie Murders, PA  [facility]

## 2018-06-29 DIAGNOSIS — E782 Mixed hyperlipidemia: Secondary | ICD-10-CM | POA: Diagnosis not present

## 2018-06-29 DIAGNOSIS — Z0000 Encounter for general adult medical examination without abnormal findings: Secondary | ICD-10-CM | POA: Diagnosis not present

## 2018-06-29 DIAGNOSIS — M1711 Unilateral primary osteoarthritis, right knee: Secondary | ICD-10-CM | POA: Diagnosis not present

## 2018-06-30 LAB — CBC WITH DIFFERENTIAL/PLATELET
Basophils Absolute: 0 10*3/uL (ref 0.0–0.2)
Basos: 1 %
EOS (ABSOLUTE): 0.3 10*3/uL (ref 0.0–0.4)
Eos: 7 %
Hematocrit: 38.2 % (ref 34.0–46.6)
Hemoglobin: 12.5 g/dL (ref 11.1–15.9)
Immature Grans (Abs): 0 10*3/uL (ref 0.0–0.1)
Immature Granulocytes: 0 %
Lymphocytes Absolute: 1.7 10*3/uL (ref 0.7–3.1)
Lymphs: 40 %
MCH: 33.9 pg — ABNORMAL HIGH (ref 26.6–33.0)
MCHC: 32.7 g/dL (ref 31.5–35.7)
MCV: 104 fL — ABNORMAL HIGH (ref 79–97)
Monocytes Absolute: 0.4 10*3/uL (ref 0.1–0.9)
Monocytes: 9 %
Neutrophils Absolute: 1.8 10*3/uL (ref 1.4–7.0)
Neutrophils: 43 %
Platelets: 185 10*3/uL (ref 150–450)
RBC: 3.69 x10E6/uL — ABNORMAL LOW (ref 3.77–5.28)
RDW: 11.8 % (ref 11.7–15.4)
WBC: 4.2 10*3/uL (ref 3.4–10.8)

## 2018-06-30 LAB — COMPREHENSIVE METABOLIC PANEL
ALT: 13 IU/L (ref 0–32)
AST: 22 IU/L (ref 0–40)
Albumin/Globulin Ratio: 1.6 (ref 1.2–2.2)
Albumin: 3.7 g/dL — ABNORMAL LOW (ref 3.8–4.8)
Alkaline Phosphatase: 53 IU/L (ref 39–117)
BUN/Creatinine Ratio: 23 (ref 12–28)
BUN: 18 mg/dL (ref 8–27)
Bilirubin Total: 0.7 mg/dL (ref 0.0–1.2)
CO2: 23 mmol/L (ref 20–29)
Calcium: 9.3 mg/dL (ref 8.7–10.3)
Chloride: 106 mmol/L (ref 96–106)
Creatinine, Ser: 0.79 mg/dL (ref 0.57–1.00)
GFR calc Af Amer: 90 mL/min/{1.73_m2} (ref >59)
GFR calc non Af Amer: 78 mL/min/{1.73_m2} (ref >59)
Globulin, Total: 2.3 g/dL (ref 1.5–4.5)
Glucose: 105 mg/dL — ABNORMAL HIGH (ref 65–99)
Potassium: 4.4 mmol/L (ref 3.5–5.2)
Sodium: 141 mmol/L (ref 134–144)
Total Protein: 6 g/dL (ref 6.0–8.5)

## 2018-06-30 LAB — LIPID PANEL
Chol/HDL Ratio: 3.5 ratio (ref 0.0–4.4)
Cholesterol, Total: 157 mg/dL (ref 100–199)
HDL: 45 mg/dL (ref >39)
LDL Calculated: 88 mg/dL (ref 0–99)
Triglycerides: 121 mg/dL (ref 0–149)
VLDL Cholesterol Cal: 24 mg/dL (ref 5–40)

## 2018-06-30 LAB — TSH: TSH: 1.23 u[IU]/mL (ref 0.450–4.500)

## 2018-07-03 ENCOUNTER — Encounter: Payer: Self-pay | Admitting: Family Medicine

## 2018-07-04 ENCOUNTER — Telehealth: Payer: Self-pay | Admitting: Family Medicine

## 2018-07-04 NOTE — Telephone Encounter (Signed)
Pt requesting lab results.

## 2018-07-05 NOTE — Telephone Encounter (Signed)
Advised   Patient requested results labs and urine.  It appears the labs had been signed off but not sent to nurse box.  Patient has been advised of the labs.  No results are in the chart for the urine and there is no order for a urine on that day.  Do you remember anything about this?

## 2018-07-06 NOTE — Telephone Encounter (Signed)
I must have written the result note and closed it without routing to the nurse pool. I don't recall any urinalysis. If she is having urinary symptoms, we can have her come by to do a urine check.

## 2018-07-07 NOTE — Telephone Encounter (Signed)
Patient was advised. Patient stated she is not having any symptoms.

## 2018-07-12 ENCOUNTER — Other Ambulatory Visit: Payer: Self-pay

## 2018-07-12 ENCOUNTER — Ambulatory Visit: Payer: PPO | Admitting: Physician Assistant

## 2018-07-12 ENCOUNTER — Ambulatory Visit
Admission: RE | Admit: 2018-07-12 | Discharge: 2018-07-12 | Disposition: A | Discharge status: Home / Self Care | Payer: PPO | Source: Ambulatory Visit | Attending: Family Medicine | Admitting: Family Medicine

## 2018-07-12 DIAGNOSIS — Z1231 Encounter for screening mammogram for malignant neoplasm of breast: Secondary | ICD-10-CM | POA: Insufficient documentation

## 2018-07-17 ENCOUNTER — Encounter: Payer: PPO | Admitting: Family Medicine

## 2018-08-18 ENCOUNTER — Other Ambulatory Visit: Payer: Self-pay | Admitting: Family Medicine

## 2018-08-18 DIAGNOSIS — M1711 Unilateral primary osteoarthritis, right knee: Secondary | ICD-10-CM

## 2018-09-28 ENCOUNTER — Encounter: Payer: Self-pay | Admitting: Family Medicine

## 2018-09-28 ENCOUNTER — Other Ambulatory Visit: Payer: Self-pay

## 2018-09-28 ENCOUNTER — Ambulatory Visit (INDEPENDENT_AMBULATORY_CARE_PROVIDER_SITE_OTHER): Payer: PPO | Admitting: Family Medicine

## 2018-09-28 VITALS — BP 134/82 | HR 74 | Temp 96.9°F | Wt 303.0 lb

## 2018-09-28 DIAGNOSIS — R202 Paresthesia of skin: Secondary | ICD-10-CM

## 2018-09-28 DIAGNOSIS — D7589 Other specified diseases of blood and blood-forming organs: Secondary | ICD-10-CM

## 2018-09-28 DIAGNOSIS — Z23 Encounter for immunization: Secondary | ICD-10-CM

## 2018-09-28 NOTE — Progress Notes (Signed)
Karen [name]  MRN: [id number] DOB: 10-21-1950  Subjective:  HPI   The patient is a 68 year old female who presents for evaluation of foot and toe pain.  It began 2 1/2 weeks ago.  It started pain in her back and down her leg.  Felt like pins and needles.  No known trauma.  Thought it was sciatica.  Pain moved to lower part of her leg, then into the heel and foot.  Now just foot and toes.    Patient Active Problem List   Diagnosis Date Noted  . Anxiety state 04/13/2014  . Acute bronchitis 04/13/2014  . Bronchitis 04/13/2014  . Cerebrovascular accident, old 04/13/2014  . Chicken pox 04/13/2014  . Condyloma acuminatum 04/13/2014  . Screening for depression 04/13/2014  . Bariatric surgery status 04/13/2014  . Cerumen impaction 04/13/2014  . Received influenza vaccination at hospital 04/13/2014  . Screening for osteoporosis 04/13/2014  . Flu vaccine need 04/13/2014  . Pain in the wrist 04/13/2014  . Mucositis oral 04/13/2014  . Avitaminosis D 04/13/2014  . Gravida 0 04/13/2014  . Screening breast examination 02/10/2009  . Family history of breast cancer 02/10/2009  . Absolute anemia 01/20/2009  . Extreme obesity 11/08/2008  . Acute inflammation of the pancreas 01/23/2008  . NEOPLASM UNCERTAIN BHV OTH&UNSPEC DIGESTIVE ORGN 08/30/2007  . Abdominal pain 08/23/2007  . CD (contact dermatitis) 03/09/2007  . Blood pressure elevated without history of HTN 03/09/2007  . Infection of the upper respiratory tract 11/02/2006  . Contusion of knee 10/31/2006  . Sprain of wrist 10/31/2006  . HLD (hyperlipidemia) 02/15/2006  . Routine general medical examination at a health care facility 02/08/2006  . Acute onset aura migraine 02/08/2006  . Benign neoplasm of skin of upper limb, including shoulder 02/08/2006  . Hypercholesterolemia without hypertriglyceridemia 07/20/2005   Past Medical History:  Diagnosis Date  . Anemia   . Hyperlipidemia   . Vitamin D deficiency    Family History   Problem Relation Age of Onset  . Hypertension Mother   . Cancer Mother   . Arthritis Mother   . Depression Mother   . Heart disease Father   . Cancer Sister   . Breast cancer Sister   . Cancer Brother   . Cancer Paternal Grandfather   . Hyperlipidemia Brother   . Heart disease Maternal Grandmother   . Stroke Maternal Grandmother   . Breast cancer Maternal Aunt    Past Surgical History:  Procedure Laterality Date  . ABDOMINAL HYSTERECTOMY    . CHOLECYSTECTOMY    . CHOLECYSTECTOMY  1987  . GASTROPLASTY  1987  . HERNIA REPAIR     Social History   Socioeconomic History  . Marital status: Married    Spouse name: Not on file  . Number of children: Not on file  . Years of education: Not on file  . Highest education level: Not on file  Occupational History  . Not on file  Social Needs  . Financial resource strain: Not on file  . Food insecurity    Worry: Not on file    Inability: Not on file  . Transportation needs    Medical: Not on file    Non-medical: Not on file  Tobacco Use  . Smoking status: Never Smoker  . Smokeless tobacco: Never Used  Substance and Sexual Activity  . Alcohol use: Yes    Alcohol/week: 1.0 standard drinks    Types: 1 Standard drinks or equivalent per week  Comment: rarely  . Drug use: No  . Sexual activity: Yes    Birth control/protection: Post-menopausal  Lifestyle  . Physical activity    Days per week: 0 days    Minutes per session: Not on file  . Stress: Not on file  Relationships  . Social Herbalist on phone: Not on file    Gets together: Not on file    Attends religious service: Not on file    Active member of club or organization: Not on file    Attends meetings of clubs or organizations: Not on file    Relationship status: Not on file  . Intimate partner violence    Fear of current or ex partner: Not on file    Emotionally abused: Not on file    Physically abused: Not on file    Forced sexual activity: Not on  file  Other Topics Concern  . Not on file  Social History Narrative  . Not on file   Outpatient Encounter Medications as of 09/28/2018  Medication Sig Note  . aspirin-acetaminophen-caffeine (EXCEDRIN MIGRAINE) 250-250-65 MG tablet Take by mouth as needed for headache.   Marland Kitchen atorvastatin (LIPITOR) 20 MG tablet TAKE 1 TABLET BY MOUTH EVERY DAY   . b complex vitamins capsule Take 2 tablets by mouth daily. 04/13/2014: Received from: [place]:   . Calcium Carbonate (CALCIUM 600 PO) Take by mouth daily.   . cholecalciferol (VITAMIN D) 1000 UNITS tablet Take 1,000 Units by mouth every other day. 2000 units on all other days 04/13/2014: 2000 units on all other days Received from: [place]:   . MULTIPLE VITAMIN PO Take by mouth daily. 04/13/2014: Received from: [place]:   . Omega-3 Fatty Acids (FISH OIL) 1000 MG CAPS Take by mouth.   . SUMAtriptan (IMITREX) 50 MG tablet Take 1 tablet by mouth as needed. 04/13/2014: Generic (Sumatriptan Succ) Received from: [facility]:   . timolol (TIMOPTIC) 0.25 % ophthalmic solution Place into both eyes.    . meloxicam (MOBIC) 15 MG tablet TAKE 1 TABLET BY MOUTH EVERY DAY (Patient not taking: Reported on 09/28/2018)    No facility-administered encounter medications on file as of 09/28/2018.    Allergies  Allergen Reactions  . Tetanus Toxoid     Multiple joint pains.   Review of Systems  Constitutional: Negative for fever.  HENT: Negative for congestion, ear pain, sinus pain and sore throat.   Musculoskeletal: Positive for joint pain and myalgias (left foot/toes).    Objective:  BP 134/82 (BP Location: Right Arm, Patient Position: Sitting, Cuff Size: Large)   Pulse 74   Temp (!) 96.9 F (36.1 C) (Skin)   Wt (!) 303 lb (137.4 kg)   SpO2 94%   BMI 54.54 kg/m   Wt Readings from Last 3 Encounters:  09/28/18 (!) 303 lb (137.4 kg)   06/23/18 (!) 303 lb 12.8 oz (137.8 kg)  11/03/17 298 lb 3.2 oz (135.3 kg)   Physical Exam  Constitutional: She is oriented to person, place, and time and well-developed, well-nourished, and in no distress.  HENT:  Head: Normocephalic.  Eyes: Conjunctivae are normal.  Neck: Neck supple.  Cardiovascular: Normal rate.  Pulmonary/Chest: Effort normal and breath sounds normal.  Abdominal: Soft.  Musculoskeletal: Normal range of motion.        General: No tenderness.  Neurological: She is alert and oriented to person, place, and time.  Skin:  No rash noted.  Psychiatric: Mood, affect and judgment normal.   Diabetic Foot Form - Detailed   Diabetic Foot Exam - detailed Diabetic Foot exam was performed with the following findings: Yes 09/28/2018 10:15 AM  Visual Foot Exam completed.: Yes  Can the patient see the bottom of their feet?: Yes Are the shoes appropriate in style and fit?: Yes Is there swelling or and abnormal foot shape?: No Is there a claw toe deformity?: No Is there elevated skin temparature?: No Is there foot or ankle muscle weakness?: No Normal Range of Motion: Yes Pulse Foot Exam completed.: Yes  Right posterior Tibialias: Present Left posterior Tibialias: Present  Right Dorsalis Pedis: Present Left Dorsalis Pedis: Present  Semmes-Weinstein Monofilament Test R Site 1-Great Toe: Pos L Site 1-Great Toe: Pos       Assessment and Plan :   1. Paresthesias Onset over the past couple weeks with some left low back discomfort and pins & needles sensation in the left leg. No specific injury known. Used some "leftover" prednisone her husband had and the paresthesia sensation cleared with only tingling in the toes remains now. No weakness or remaining back pains. History of macrocytosis. Will recheck for B12 deficiency or anemia. May use Aleve prn for any remaining inflammation along the sciatic nerve. Recheck pending reports.  - CBC with Differential/Platelet - B12 and Folate  Panel  2. Macrocytosis RBC indices indicates some increase in RBC size. Has been taking B-complex vitamin daily. With tingling sensation in the left toes, will recheck CBC, B12 and Folate levels. - CBC with Differential/Platelet - B12 and Folate Panel  3. Need for influenza vaccination - Flu Vaccine QUAD High Dose(Fluad)

## 2018-09-29 ENCOUNTER — Telehealth: Payer: Self-pay

## 2018-09-29 LAB — CBC WITH DIFFERENTIAL/PLATELET
Basophils Absolute: 0 10*3/uL (ref 0.0–0.2)
Basos: 1 %
EOS (ABSOLUTE): 0.2 10*3/uL (ref 0.0–0.4)
Eos: 4 %
Hematocrit: 41.6 % (ref 34.0–46.6)
Hemoglobin: 13.4 g/dL (ref 11.1–15.9)
Immature Grans (Abs): 0 10*3/uL (ref 0.0–0.1)
Immature Granulocytes: 0 %
Lymphocytes Absolute: 1.5 10*3/uL (ref 0.7–3.1)
Lymphs: 25 %
MCH: 32.1 pg (ref 26.6–33.0)
MCHC: 32.2 g/dL (ref 31.5–35.7)
MCV: 100 fL — ABNORMAL HIGH (ref 79–97)
Monocytes Absolute: 0.6 10*3/uL (ref 0.1–0.9)
Monocytes: 10 %
Neutrophils Absolute: 3.6 10*3/uL (ref 1.4–7.0)
Neutrophils: 60 %
Platelets: 162 10*3/uL (ref 150–450)
RBC: 4.18 x10E6/uL (ref 3.77–5.28)
RDW: 11.7 % (ref 11.7–15.4)
WBC: 5.9 10*3/uL (ref 3.4–10.8)

## 2018-09-29 LAB — B12 AND FOLATE PANEL
Folate: 18.5 ng/mL (ref >3.0)
Vitamin B-12: 565 pg/mL (ref 232–1245)

## 2018-09-29 NOTE — Telephone Encounter (Signed)
Patient notified of lab results

## 2018-09-29 NOTE — Telephone Encounter (Signed)
-----   Message from [employer], [place] sent at 09/29/2018  8:08 AM EDT ----- RBC size is back to your average and B12 level normal. Continue B-complex vitamin once a day. Recheck in 6 months.

## 2018-11-03 ENCOUNTER — Other Ambulatory Visit: Payer: Self-pay | Admitting: Family Medicine

## 2018-12-05 ENCOUNTER — Other Ambulatory Visit: Payer: Self-pay

## 2019-02-02 ENCOUNTER — Other Ambulatory Visit: Payer: Self-pay | Admitting: Family Medicine

## 2019-02-12 ENCOUNTER — Other Ambulatory Visit: Payer: Self-pay | Admitting: Family Medicine

## 2019-02-12 DIAGNOSIS — M1711 Unilateral primary osteoarthritis, right knee: Secondary | ICD-10-CM

## 2019-02-12 NOTE — Telephone Encounter (Signed)
Pt following up on Rx meloxicam (MOBIC) 15 MG tablet Pt has made her cpe for 06/24/2019. Would at least like a 30 day that should last her until her appt.  [id number] - [place], [place] - [address] Phone:  [phone]  Fax:  [phone]

## 2019-03-28 ENCOUNTER — Telehealth: Payer: Self-pay | Admitting: Family Medicine

## 2019-03-28 NOTE — Telephone Encounter (Signed)
I contacted patient to schedule her AWV with M[facility] and the patient mentioned she had called the office to request a refill on her Meloxicam. Patient states this approximately a month ago and she still has not been able to get a refill or a phone call back about it. [facility] [place].

## 2019-03-29 ENCOUNTER — Other Ambulatory Visit: Payer: Self-pay | Admitting: Family Medicine

## 2019-03-29 DIAGNOSIS — M1711 Unilateral primary osteoarthritis, right knee: Secondary | ICD-10-CM

## 2019-03-29 MED ORDER — MELOXICAM 15 MG PO TABS: 15.0000 mg | ORAL_TABLET | Freq: Every day | ORAL | 0 refills | Status: DC

## 2019-03-29 NOTE — Telephone Encounter (Signed)
Sent refill to pharmacy to cover till appointment time.

## 2019-04-02 ENCOUNTER — Ambulatory Visit: Payer: PPO

## 2019-04-04 NOTE — Progress Notes (Addendum)
Subjective:   Karen [name] is a 69 y.o. female who presents for an Initial Medicare Annual Wellness Visit.    This visit is being conducted through telemedicine due to the COVID-19 pandemic. This patient has given me verbal consent via doximity to conduct this visit, patient states they are participating from their home address. Some vital signs may be absent or patient reported.    Patient identification: identified by name, DOB, and current address  Review of Systems    N/A   Cardiac Risk Factors include: advanced age (>65men, >62 women);dyslipidemia     Objective:    Today's Vitals   04/05/19 1353  PainSc: 0-No pain   There is no height or weight on file to calculate BMI. Unable to obtain vitals due to visit being conducted via telephonically.    Advanced Directives 04/05/2019 02/05/2016 10/24/2014 06/17/2014  Does Patient Have a Medical Advance Directive? No No No No  Would patient like information on creating a medical advance directive? No - Patient declined - - -    Current Medications (verified) Outpatient Encounter Medications as of 04/05/2019  Medication Sig   aspirin (ASPIRIN ADULT LOW DOSE) 81 MG EC tablet Take 81 mg by mouth every other day. Swallow whole.   aspirin-acetaminophen-caffeine (EXCEDRIN MIGRAINE) 250-250-65 MG tablet Take by mouth as needed for headache.   atorvastatin (LIPITOR) 20 MG tablet TAKE 1 TABLET BY MOUTH EVERY DAY   b complex vitamins capsule Take 1 capsule by mouth daily.    Calcium Carbonate (CALCIUM 600 PO) Take by mouth daily.   cholecalciferol (VITAMIN D) 1000 UNITS tablet Take 1,000 Units by mouth daily.    meloxicam (MOBIC) 15 MG tablet Take 1 tablet (15 mg total) by mouth daily.   MULTIPLE VITAMIN PO Take by mouth daily.   Omega-3 Fatty Acids (FISH OIL) 1000 MG CAPS Take by mouth daily.    timolol (TIMOPTIC) 0.25 % ophthalmic solution Place 1 drop into both eyes daily.    SUMAtriptan (IMITREX) 50 MG tablet Take 1 tablet by mouth as  needed.   No facility-administered encounter medications on file as of 04/05/2019.    Allergies (verified) Tetanus toxoid   History: Past Medical History:  Diagnosis Date   Anemia    Hyperlipidemia    Vitamin D deficiency    Past Surgical History:  Procedure Laterality Date   ABDOMINAL HYSTERECTOMY     CHOLECYSTECTOMY     CHOLECYSTECTOMY  1987   GASTROPLASTY  1987   HERNIA REPAIR     Family History  Problem Relation Age of Onset   Hypertension Mother    Cancer Mother    Arthritis Mother    Depression Mother    Heart disease Father    Breast cancer Sister    Pancreatic cancer Brother    Cancer Paternal Grandfather    Hyperlipidemia Brother    Hypertension Brother    Heart disease Maternal Grandmother    Stroke Maternal Grandmother    Breast cancer Maternal Aunt    Social History   Socioeconomic History   Marital status: Married    Spouse name: Not on file   Number of children: 0   Years of education: Not on file   Highest education level: High school graduate  Occupational History   Occupation: retired  Tobacco Use   Smoking status: Never Smoker   Smokeless tobacco: Never Used  Substance and Sexual Activity   Alcohol use: Yes    Comment: rarely Liqueur   Drug use:  No   Sexual activity: Yes    Birth control/protection: Post-menopausal  Other Topics Concern   Not on file  Social History Narrative   Not on file   Social Determinants of Health   Financial Resource Strain: Low Risk    Difficulty of Paying Living Expenses: Not hard at all  Food Insecurity: No Food Insecurity   Worried About Charity fundraiser in the Last Year: Never true   [place] in the Last Year: Never true  Transportation Needs: No Transportation Needs   Lack of Transportation (Medical): No   Lack of Transportation (Non-Medical): No  Physical Activity: Inactive   Days of Exercise per Week: 0 days   Minutes of Exercise per Session: 0 min  Stress: No Stress Concern Present    Feeling of Stress : Not at all  Social Connections: Slightly Isolated   Frequency of Communication with Friends and Family: More than three times a week   Frequency of Social Gatherings with Friends and Family: Once a week   Attends Religious Services: Never   Marine scientist or Organizations: Yes   Attends Music therapist: More than 4 times per year   Marital Status: Married    Tobacco Counseling Counseling given: Not Answered   Clinical Intake:  Pre-visit preparation completed: Yes  Pain : No/denies pain Pain Score: 0-No pain     Nutritional Risks: None Diabetes: No  How often do you need to have someone help you when you read instructions, pamphlets, or other written materials from your doctor or pharmacy?: 1 - Never  Interpreter Needed?: No  Information entered by :: [facility], LPN   Activities of Daily Living In your present state of health, do you have any difficulty performing the following activities: 04/05/2019 06/23/2018  Hearing? N N  Vision? N N  Difficulty concentrating or making decisions? N N  Walking or climbing stairs? Y Y  Comment Due to knee pains and fear of falling. -  Dressing or bathing? N N  Doing errands, shopping? N N  Preparing Food and eating ? N -  Using the Toilet? N -  In the past six months, have you accidently leaked urine? N -  Do you have problems with loss of bowel control? N -  Managing your Medications? N -  Managing your Finances? N -  Housekeeping or managing your Housekeeping? N -  Some recent data might be hidden     Immunizations and Health Maintenance Immunization History  Administered Date(s) Administered   Fluad Quad(high Dose 65+) 09/28/2018   Influenza Split 11/08/2008, 02/23/2011, 11/23/2011   Influenza, High Dose Seasonal PF 11/03/2017   Influenza,inj,Quad PF,6+ Mos 11/08/2013, 10/24/2014, 10/15/2016   Influenza-Unspecified 11/08/2013   Pneumococcal Conjugate-13 05/10/2017, 11/03/2017     Zoster 03/20/2015   Health Maintenance Due  Topic Date Due   PNA vac Low Risk Adult (2 of 2 - PPSV23) 11/04/2018    Patient Care Team: Chrismon, Vickki Muff, PA as PCP - General (Physician Assistant) Arelia Sneddon, [place] (Optometry)  Indicate any recent Medical Services you may have received from other than [facility] providers in the past year (date may be approximate).     Assessment:   This is a routine wellness examination for Pierre.  Hearing/Vision screen No exam data present  Dietary issues and exercise activities discussed: Current Exercise Habits: The patient does not participate in regular exercise at present, Exercise limited by: None identified  Goals  Exercise 3x per week (30 min per time)     Recommend to exercise for 3 days a week for at least 30 minutes at a time.        Depression Screen PHQ 2/9 Scores 04/05/2019 06/23/2018 11/03/2017 05/02/2017 04/29/2016 03/20/2015  PHQ - 2 Score 0 0 0 0 0 0  PHQ- 9 Score - 0 - 0 0 -    Fall Risk Fall Risk  04/05/2019 12/05/2018 11/03/2017 04/29/2016  Falls in the past year? 0 0 No No  Comment - Emmi Telephone Survey: data to providers prior to load - -  Number falls in past yr: 0 - - -  Injury with Fall? 0 - - -   FALL RISK PREVENTION PERTAINING TO THE HOME:  Any stairs in or around the home? Yes  If so, are there any without handrails? No   Home free of loose throw rugs in walkways, pet beds, electrical cords, etc? Yes  Adequate lighting in your home to reduce risk of falls? Yes   ASSISTIVE DEVICES UTILIZED TO PREVENT FALLS:  Life alert? No  Use of a cane, walker or w/c? No  Grab bars in the bathroom? Yes  Shower chair or bench in shower? No  Elevated toilet seat or a handicapped toilet? No    TIMED UP AND GO:  Was the test performed? No .     Cognitive Function:     6CIT Screen 04/05/2019  What Year? 0 points  What month? 0 points  What time? 0 points  Count back from 20 0 points  Months in reverse 0  points  Repeat phrase 0 points  Total Score 0    Screening Tests Health Maintenance  Topic Date Due   PNA vac Low Risk Adult (2 of 2 - PPSV23) 11/04/2018   Fecal DNA (Cologuard)  06/07/2019   MAMMOGRAM  07/12/2019   DEXA SCAN  04/15/2020   TETANUS/TDAP  03/19/2025   INFLUENZA VACCINE  Completed   Hepatitis C Screening  Completed    Qualifies for Shingles Vaccine? Yes  Zostavax completed 03/20/15. Due for Shingrix. Pt has been advised to call insurance company to determine out of pocket expense. Advised may also receive vaccine at local pharmacy or Health Dept. Verbalized acceptance and understanding.  Flu Vaccine: Up to date  Pneumococcal Vaccine: Due for Pneumococcal vaccine. Does the patient want to receive this vaccine today?  No . Advised may receive this vaccine at local pharmacy or Health Dept. Aware to provide a copy of the vaccination record if obtained from local pharmacy or Health Dept. Verbalized acceptance and understanding.   Cancer Screenings:  Colorectal Screening: Cologuard completed 06/06/16. Repeat every 3 years.   Mammogram: Completed 07/12/18. Repeat every 1-2 years as advised.   Bone Density: Completed 04/16/15. Results reflect OSTEOPENIA. Repeat every 5 years.   Lung Cancer Screening: (Low Dose CT Chest recommended if Age 91-80 years, 30 pack-year currently smoking OR have quit w/in 15years.) does not qualify.   Additional Screening:  Hepatitis C Screening: Up to date  Vision Screening: Recommended annual ophthalmology exams for early detection of glaucoma and other disorders of the eye.  Dental Screening: Recommended annual dental exams for proper oral hygiene  Community Resource Referral:  CRR required this visit?  No       Plan:  I have personally reviewed and addressed the Medicare Annual Wellness questionnaire and have noted the following in the patient's chart:  A. Medical and social history B. Use of alcohol,  tobacco or illicit drugs   C. Current medications and supplements D. Functional ability and status E.  Nutritional status F.  Physical activity G. Advance directives H. List of other physicians I.  Hospitalizations, surgeries, and ER visits in previous 12 months J.  [place] such as hearing and vision if needed, cognitive and depression L. Referrals and appointments   In addition, I have reviewed and discussed with patient certain preventive protocols, quality metrics, and best practice recommendations. A written personalized care plan for preventive services as well as general preventive health recommendations were provided to patient.   [place] Score, [place]   [id number]  Nurse Health Advisor   Nurse Notes: Pt would like to receive her Pneumovax 23 vaccine at next in office apt.   Reviewed note of Nurse Health Advisor's screening and plan. Was available for consultation. Agree with documentation and recommendations.

## 2019-04-05 ENCOUNTER — Ambulatory Visit (INDEPENDENT_AMBULATORY_CARE_PROVIDER_SITE_OTHER): Payer: PPO

## 2019-04-05 ENCOUNTER — Other Ambulatory Visit: Payer: Self-pay | Admitting: Family Medicine

## 2019-04-05 ENCOUNTER — Other Ambulatory Visit: Payer: Self-pay

## 2019-04-05 DIAGNOSIS — Z0000 Encounter for general adult medical examination without abnormal findings: Secondary | ICD-10-CM

## 2019-04-05 DIAGNOSIS — M1711 Unilateral primary osteoarthritis, right knee: Secondary | ICD-10-CM

## 2019-04-05 NOTE — Patient Instructions (Signed)
Karen [name] , Thank you for taking time to come for your Medicare Wellness Visit. I appreciate your ongoing commitment to your health goals. Please review the following plan we discussed and let me know if I can assist you in the future.   Screening recommendations/referrals: Colonoscopy: Cologuard up to date, due 06/07/19 Mammogram: Up to date, due 07/2020 Bone Density: Up to date, due 04/2020 Recommended yearly ophthalmology/optometry visit for glaucoma screening and checkup Recommended yearly dental visit for hygiene and checkup  Vaccinations: Influenza vaccine: Up to date Pneumococcal vaccine: Pt declines today.  Shingles vaccine: Pt declines today.     Advanced directives: Advance directive discussed with you today. Even though you declined this today please call our office should you change your mind and we can give you the proper paperwork for you to fill out.  Conditions/risks identified: Recommend to exercise for 3 days a week for at least 30 minutes at a time.   Next appointment: 06/25/19 @ 10:00 AM with [facility] 67 Years and Older, Female Preventive care refers to lifestyle choices and visits with your health care provider that can promote health and wellness. What does preventive care include?  A yearly physical exam. This is also called an annual well check.  Dental exams once or twice a year.  Routine eye exams. Ask your health care provider how often you should have your eyes checked.  Personal lifestyle choices, including:  Daily care of your teeth and gums.  Regular physical activity.  Eating a healthy diet.  Avoiding tobacco and drug use.  Limiting alcohol use.  Practicing safe sex.  Taking low-dose aspirin every day.  Taking vitamin and mineral supplements as recommended by your health care provider. What happens during an annual well check? The services and screenings done by your health care provider during your annual well  check will depend on your age, overall health, lifestyle risk factors, and family history of disease. Counseling  Your health care provider may ask you questions about your:  Alcohol use.  Tobacco use.  Drug use.  Emotional well-being.  Home and relationship well-being.  Sexual activity.  Eating habits.  History of falls.  Memory and ability to understand (cognition).  Work and work Statistician.  Reproductive health. Screening  You may have the following tests or measurements:  Height, weight, and BMI.  Blood pressure.  Lipid and cholesterol levels. These may be checked every 5 years, or more frequently if you are over 44 years old.  Skin check.  Lung cancer screening. You may have this screening every year starting at age 16 if you have a 30-pack-year history of smoking and currently smoke or have quit within the past 15 years.  Fecal occult blood test (FOBT) of the stool. You may have this test every year starting at age 17.  Flexible sigmoidoscopy or colonoscopy. You may have a sigmoidoscopy every 5 years or a colonoscopy every 10 years starting at age 81.  Hepatitis C blood test.  Hepatitis B blood test.  Sexually transmitted disease (STD) testing.  Diabetes screening. This is done by checking your blood sugar (glucose) after you have not eaten for a while (fasting). You may have this done every 1-3 years.  Bone density scan. This is done to screen for osteoporosis. You may have this done starting at age 86.  Mammogram. This may be done every 1-2 years. Talk to your health care provider about how often you should have regular mammograms. Talk  with your health care provider about your test results, treatment options, and if necessary, the need for more tests. Vaccines  Your health care provider may recommend certain vaccines, such as:  Influenza vaccine. This is recommended every year.  Tetanus, diphtheria, and acellular pertussis (Tdap, Td) vaccine. You  may need a Td booster every 10 years.  Zoster vaccine. You may need this after age 28.  Pneumococcal 13-valent conjugate (PCV13) vaccine. One dose is recommended after age 63.  Pneumococcal polysaccharide (PPSV23) vaccine. One dose is recommended after age 36. Talk to your health care provider about which screenings and vaccines you need and how often you need them. This information is not intended to replace advice given to you by your health care provider. Make sure you discuss any questions you have with your health care provider. Document Released: 01/24/2015 Document Revised: 09/17/2015 Document Reviewed: 10/29/2014 Elsevier Interactive Patient Education  2017 [place] Prevention in the Home Falls can cause injuries. They can happen to people of all ages. There are many things you can do to make your home safe and to help prevent falls. What can I do on the outside of my home?  Regularly fix the edges of walkways and driveways and fix any cracks.  Remove anything that might make you trip as you walk through a door, such as a raised step or threshold.  Trim any bushes or trees on the path to your home.  Use bright outdoor lighting.  Clear any walking paths of anything that might make someone trip, such as rocks or tools.  Regularly check to see if handrails are loose or broken. Make sure that both sides of any steps have handrails.  Any raised decks and porches should have guardrails on the edges.  Have any leaves, snow, or ice cleared regularly.  Use sand or salt on walking paths during winter.  Clean up any spills in your garage right away. This includes oil or grease spills. What can I do in the bathroom?  Use night lights.  Install grab bars by the toilet and in the tub and shower. Do not use towel bars as grab bars.  Use non-skid mats or decals in the tub or shower.  If you need to sit down in the shower, use a plastic, non-slip stool.  Keep the floor  dry. Clean up any water that spills on the floor as soon as it happens.  Remove soap buildup in the tub or shower regularly.  Attach bath mats securely with double-sided non-slip rug tape.  Do not have throw rugs and other things on the floor that can make you trip. What can I do in the bedroom?  Use night lights.  Make sure that you have a light by your bed that is easy to reach.  Do not use any sheets or blankets that are too big for your bed. They should not hang down onto the floor.  Have a firm chair that has side arms. You can use this for support while you get dressed.  Do not have throw rugs and other things on the floor that can make you trip. What can I do in the kitchen?  Clean up any spills right away.  Avoid walking on wet floors.  Keep items that you use a lot in easy-to-reach places.  If you need to reach something above you, use a strong step stool that has a grab bar.  Keep electrical cords out of the way.  Do  not use floor polish or wax that makes floors slippery. If you must use wax, use non-skid floor wax.  Do not have throw rugs and other things on the floor that can make you trip. What can I do with my stairs?  Do not leave any items on the stairs.  Make sure that there are handrails on both sides of the stairs and use them. Fix handrails that are broken or loose. Make sure that handrails are as long as the stairways.  Check any carpeting to make sure that it is firmly attached to the stairs. Fix any carpet that is loose or worn.  Avoid having throw rugs at the top or bottom of the stairs. If you do have throw rugs, attach them to the floor with carpet tape.  Make sure that you have a light switch at the top of the stairs and the bottom of the stairs. If you do not have them, ask someone to add them for you. What else can I do to help prevent falls?  Wear shoes that:  Do not have high heels.  Have rubber bottoms.  Are comfortable and fit you  well.  Are closed at the toe. Do not wear sandals.  If you use a stepladder:  Make sure that it is fully opened. Do not climb a closed stepladder.  Make sure that both sides of the stepladder are locked into place.  Ask someone to hold it for you, if possible.  Clearly mark and make sure that you can see:  Any grab bars or handrails.  First and last steps.  Where the edge of each step is.  Use tools that help you move around (mobility aids) if they are needed. These include:  Canes.  Walkers.  Scooters.  Crutches.  Turn on the lights when you go into a dark area. Replace any light bulbs as soon as they burn out.  Set up your furniture so you have a clear path. Avoid moving your furniture around.  If any of your floors are uneven, fix them.  If there are any pets around you, be aware of where they are.  Review your medicines with your doctor. Some medicines can make you feel dizzy. This can increase your chance of falling. Ask your doctor what other things that you can do to help prevent falls. This information is not intended to replace advice given to you by your health care provider. Make sure you discuss any questions you have with your health care provider. Document Released: 10/24/2008 Document Revised: 06/05/2015 Document Reviewed: 02/01/2014 Elsevier Interactive Patient Education  2017 [employer].

## 2019-04-06 MED ORDER — MELOXICAM 15 MG PO TABS: 15.0000 mg | ORAL_TABLET | Freq: Every day | ORAL | 0 refills | Status: DC

## 2019-06-12 ENCOUNTER — Other Ambulatory Visit: Payer: Self-pay | Admitting: Family Medicine

## 2019-06-12 DIAGNOSIS — Z1231 Encounter for screening mammogram for malignant neoplasm of breast: Secondary | ICD-10-CM

## 2019-06-25 ENCOUNTER — Telehealth: Payer: Self-pay

## 2019-06-25 ENCOUNTER — Encounter: Payer: PPO | Admitting: Family Medicine

## 2019-06-25 NOTE — Telephone Encounter (Signed)
Copied from [place] [id number]. Topic: General - Other >> Jun 25, 2019  8:06 AM Oneta Rack wrote: Reason for CRM: patient had to cancel her 10am appointment due to being in the ER since 4:30am with spouse. Patient [facility] to 6/25 with PCP

## 2019-07-06 ENCOUNTER — Encounter: Payer: Self-pay | Admitting: Family Medicine

## 2019-07-06 ENCOUNTER — Ambulatory Visit (INDEPENDENT_AMBULATORY_CARE_PROVIDER_SITE_OTHER): Payer: PPO | Admitting: Family Medicine

## 2019-07-06 ENCOUNTER — Other Ambulatory Visit: Payer: Self-pay

## 2019-07-06 VITALS — BP 144/84 | HR 65 | Temp 97.5°F | Resp 16 | Ht 63.0 in | Wt 308.0 lb

## 2019-07-06 DIAGNOSIS — Z0000 Encounter for general adult medical examination without abnormal findings: Secondary | ICD-10-CM | POA: Diagnosis not present

## 2019-07-06 DIAGNOSIS — G8929 Other chronic pain: Secondary | ICD-10-CM | POA: Diagnosis not present

## 2019-07-06 DIAGNOSIS — Z23 Encounter for immunization: Secondary | ICD-10-CM | POA: Diagnosis not present

## 2019-07-06 DIAGNOSIS — M545 Low back pain: Secondary | ICD-10-CM | POA: Diagnosis not present

## 2019-07-06 DIAGNOSIS — E782 Mixed hyperlipidemia: Secondary | ICD-10-CM | POA: Diagnosis not present

## 2019-07-06 DIAGNOSIS — Z1211 Encounter for screening for malignant neoplasm of colon: Secondary | ICD-10-CM

## 2019-07-06 NOTE — Progress Notes (Signed)
Complete physical exam  I,April Miller,acting as a scribe for [employer], [employer].,have documented all relevant documentation on the behalf of [employer], [employer],as directed by  [employer] while in the presence of [employer], [place].   Patient: Karen [name]   DOB: 10-16-1950   69 y.o. Female  MRN: [id number] Visit Date: 07/06/2019  Today's healthcare provider: Vernie Murders, PA   Chief Complaint  Patient presents with   Annual Exam   Subjective    Karen [name] is a 69 y.o. female who presents today for a complete physical exam.  She reports consuming a general diet. The patient does not participate in regular exercise at present. She generally feels well. She reports sleeping fairly well. She does not have additional problems to discuss today.   Patient had AWV with [facility] on 04/05/2019.  Past Medical History:  Diagnosis Date   Anemia    Hyperlipidemia    Vitamin D deficiency    Past Surgical History:  Procedure Laterality Date   ABDOMINAL HYSTERECTOMY     CHOLECYSTECTOMY  1987   GASTROPLASTY  1987   HERNIA REPAIR     Social History   Socioeconomic History   Marital status: Married    Spouse name: Not on file   Number of children: 0   Years of education: Not on file   Highest education level: High school graduate  Occupational History   Occupation: retired  Tobacco Use   Smoking status: Never Smoker   Smokeless tobacco: Never Used  Scientific laboratory technician Use: Never used  Substance and Sexual Activity   Alcohol use: Yes    Comment: rarely Liqueur   Drug use: No   Sexual activity: Yes    Birth control/protection: Post-menopausal  Other Topics Concern   Not on file  Social History Narrative   Not on file   Social Determinants of Health   Financial Resource Strain: Low Risk    Difficulty of Paying Living Expenses: Not hard at all  Food Insecurity: No Food Insecurity   Worried About Charity fundraiser in the Last Year: Never true   [place] in the Last Year: Never true  Transportation Needs: No Transportation Needs   Lack of Transportation (Medical): No   Lack of Transportation (Non-Medical): No  Physical Activity: Inactive   Days of Exercise per Week: 0 days   Minutes of Exercise per Session: 0 min  Stress: No Stress Concern Present   Feeling of Stress : Not at all  Social Connections: Moderately Integrated   Frequency of Communication with Friends and Family: More than three times a week   Frequency of Social Gatherings with Friends and Family: Once a week   Attends Religious Services: Never   Marine scientist or Organizations: Yes   Attends Music therapist: More than 4 times per year   Marital Status: Married  Human resources officer Violence: Not At Risk   Fear of Current or Ex-Partner: No   Emotionally Abused: No   Physically Abused: No   Sexually Abused: No   Family Status  Relation Name Status   Mother  Deceased   Father  Deceased   Sister  Deceased   Brother  Deceased   PGF  Deceased   Brother  Alive   MGM  (Not Specified)   Mat Aunt  (Not Specified)   Family History  Problem Relation Age of Onset   Hypertension Mother  Cancer Mother    Arthritis Mother    Depression Mother    Heart disease Father    Breast cancer Sister    Pancreatic cancer Brother    Cancer Paternal Grandfather    Hyperlipidemia Brother    Hypertension Brother    Heart disease Maternal Grandmother    Stroke Maternal Grandmother    Breast cancer Maternal Aunt    Allergies  Allergen Reactions   Tetanus Toxoid     Multiple joint pains.    Patient Care Team: Aliviana Burdell, Vickki Muff, PA as PCP - General (Physician Assistant) Arelia Sneddon, OD (Optometry)   Medications: Outpatient Medications Prior to Visit  Medication Sig   aspirin (ASPIRIN ADULT LOW DOSE) 81 MG EC tablet Take 81 mg by mouth every other day. Swallow whole.   aspirin-acetaminophen-caffeine (EXCEDRIN MIGRAINE) 250-250-65 MG tablet Take  by mouth as needed for headache.   atorvastatin (LIPITOR) 20 MG tablet TAKE 1 TABLET BY MOUTH EVERY DAY   b complex vitamins capsule Take 1 capsule by mouth daily.    Calcium Carbonate (CALCIUM 600 PO) Take by mouth daily.   cholecalciferol (VITAMIN D) 1000 UNITS tablet Take 1,000 Units by mouth daily.    meloxicam (MOBIC) 15 MG tablet Take 1 tablet (15 mg total) by mouth daily.   MULTIPLE VITAMIN PO Take by mouth daily.   Omega-3 Fatty Acids (FISH OIL) 1000 MG CAPS Take by mouth daily.    SUMAtriptan (IMITREX) 50 MG tablet Take 1 tablet by mouth as needed.   timolol (TIMOPTIC) 0.25 % ophthalmic solution Place 1 drop into both eyes daily.    No facility-administered medications prior to visit.    Review of Systems  Constitutional: Negative.   HENT: Negative.    Respiratory: Negative.    Cardiovascular: Negative.   Gastrointestinal: Negative.   Genitourinary: Negative.      Objective    Pulse 65    Temp (!) 97.5 F (36.4 C) (Other (Comment))    Resp 16    Ht 5\' 3"  (1.6 m)    Wt (!) 308 lb (139.7 kg)    SpO2 96%    BMI 54.56 kg/m    Physical Exam Vitals and nursing note reviewed.  Constitutional:      Appearance: Normal appearance. She is normal weight.  HENT:     Right Ear: Tympanic membrane normal.     Left Ear: Tympanic membrane normal.     Nose: Nose normal.     Mouth/Throat:     Mouth: Mucous membranes are moist.     Pharynx: Oropharynx is clear.  Eyes:     Conjunctiva/sclera: Conjunctivae normal.  Cardiovascular:     Rate and Rhythm: Normal rate and regular rhythm.     Pulses: Normal pulses.     Heart sounds: Normal heart sounds.  Pulmonary:     Effort: Pulmonary effort is normal.     Breath sounds: Normal breath sounds.  Abdominal:     General: Bowel sounds are normal.     Palpations: Abdomen is soft.  Musculoskeletal:        General: Normal range of motion.     Cervical back: Normal range of motion and neck supple.  Skin:    General: Skin is warm and  dry.  Neurological:     Mental Status: She is alert.  Psychiatric:        Mood and Affect: Mood normal.        Behavior: Behavior normal.  Thought Content: Thought content normal.        Judgment: Judgment normal.     Last depression screening scores PHQ 2/9 Scores 04/05/2019 06/23/2018 11/03/2017  PHQ - 2 Score 0 0 0  PHQ- 9 Score - 0 -   Last fall risk screening Fall Risk  04/05/2019  Falls in the past year? 0  Comment -  Number falls in past yr: 0  Injury with Fall? 0   Last Audit-C alcohol use screening Alcohol Use Disorder Test (AUDIT) 04/05/2019  1. How often do you have a drink containing alcohol? 1  2. How many drinks containing alcohol do you have on a typical day when you are drinking? 0  3. How often do you have six or more drinks on one occasion? 0  AUDIT-C Score 1  Alcohol Brief Interventions/Follow-up AUDIT Score <7 follow-up not indicated   A score of 3 or more in women, and 4 or more in men indicates increased risk for alcohol abuse, EXCEPT if all of the points are from question 1   No results found for any visits on 07/06/19.  Assessment & Plan    Routine Health Maintenance and Physical Exam  Exercise Activities and Dietary recommendations Goals      Exercise 3x per week (30 min per time)     Recommend to exercise for 3 days a week for at least 30 minutes at a time.         Immunization History  Administered Date(s) Administered   Fluad Quad(high Dose 65+) 09/28/2018   Influenza Split 11/08/2008, 02/23/2011, 11/23/2011   Influenza, High Dose Seasonal PF 11/03/2017   Influenza,inj,Quad PF,6+ Mos 11/08/2013, 10/24/2014, 10/15/2016   Influenza-Unspecified 11/08/2013   Pneumococcal Conjugate-13 05/10/2017, 11/03/2017   Zoster 03/20/2015    Health Maintenance  Topic Date Due   COVID-19 Vaccine (1) Never done   PNA vac Low Risk Adult (2 of 2 - PPSV23) 11/04/2018   Fecal DNA (Cologuard)  06/07/2019   MAMMOGRAM  07/12/2019   INFLUENZA VACCINE   08/12/2019   DEXA SCAN  04/15/2020   TETANUS/TDAP  03/19/2025   Hepatitis C Screening  Completed    Discussed health benefits of physical activity, and encouraged her to engage in regular exercise appropriate for her age and condition.  1. Annual physical exam General health stable with severe obesity . Still having flares of osteoarthritis probably secondary to morbid obesity. Given anticipatory counseling and recheck labs. - CBC with Differential/Platelet - Comprehensive metabolic panel - Lipid panel - TSH  2. Obesity, morbid, BMI 50 or higher ([place]) History of bariatric surgery in 1987 and has regained the weight. Arthritis of knees and back ache limits activity level. States she still eats only small meals. Will recheck labs to evaluate metabolism. - Comprehensive metabolic panel - Lipid panel - TSH - Hemoglobin A1c  3. Mixed hyperlipidemia Tolerating Atorvastatin 20 mg qd with Omega-3 Fish Oil 1000 mg qd without side effects. Encouraged to watch fats/fried foods in diet. Recheck labs. - Comprehensive metabolic panel - Lipid panel - TSH  4. Chronic midline low back pain without sciatica Having low mid back pains that eases with rest. Has been lifting a little extra. No radiation of pain into legs or feet. May continue to use the Meloxicam and should apply moist heat.  5. Need for prophylactic vaccination against Streptococcus pneumoniae (pneumococcus) Given Pneumovax-23 today.  6. Screening for colon cancer Negative cologuard on 05-31-16. Due for repeat test. - Cologuard   No follow-ups on  file.     Andres Shad, PA, have reviewed all documentation for this visit. The documentation on 07/06/19 for the exam, diagnosis, procedures, and orders are all accurate and complete.    Vernie Murders, [place] [phone] (phone) [phone] (fax)  [place]

## 2019-07-12 DIAGNOSIS — Z0000 Encounter for general adult medical examination without abnormal findings: Secondary | ICD-10-CM | POA: Diagnosis not present

## 2019-07-12 DIAGNOSIS — E782 Mixed hyperlipidemia: Secondary | ICD-10-CM | POA: Diagnosis not present

## 2019-07-13 ENCOUNTER — Ambulatory Visit
Admission: RE | Admit: 2019-07-13 | Discharge: 2019-07-13 | Disposition: A | Discharge status: Home / Self Care | Payer: PPO | Source: Ambulatory Visit | Attending: Family Medicine | Admitting: Family Medicine

## 2019-07-13 DIAGNOSIS — Z1231 Encounter for screening mammogram for malignant neoplasm of breast: Secondary | ICD-10-CM | POA: Insufficient documentation

## 2019-07-13 LAB — CBC WITH DIFFERENTIAL/PLATELET
Basophils Absolute: 0 10*3/uL (ref 0.0–0.2)
Basos: 1 %
EOS (ABSOLUTE): 0.3 10*3/uL (ref 0.0–0.4)
Eos: 6 %
Hematocrit: 38.4 % (ref 34.0–46.6)
Hemoglobin: 12.7 g/dL (ref 11.1–15.9)
Immature Grans (Abs): 0 10*3/uL (ref 0.0–0.1)
Immature Granulocytes: 0 %
Lymphocytes Absolute: 1.5 10*3/uL (ref 0.7–3.1)
Lymphs: 29 %
MCH: 33.3 pg — ABNORMAL HIGH (ref 26.6–33.0)
MCHC: 33.1 g/dL (ref 31.5–35.7)
MCV: 101 fL — ABNORMAL HIGH (ref 79–97)
Monocytes Absolute: 0.5 10*3/uL (ref 0.1–0.9)
Monocytes: 9 %
Neutrophils Absolute: 2.9 10*3/uL (ref 1.4–7.0)
Neutrophils: 55 %
Platelets: 214 10*3/uL (ref 150–450)
RBC: 3.81 x10E6/uL (ref 3.77–5.28)
RDW: 11.9 % (ref 11.7–15.4)
WBC: 5.2 10*3/uL (ref 3.4–10.8)

## 2019-07-13 LAB — COMPREHENSIVE METABOLIC PANEL
ALT: 13 IU/L (ref 0–32)
AST: 21 IU/L (ref 0–40)
Albumin/Globulin Ratio: 1.5 (ref 1.2–2.2)
Albumin: 3.7 g/dL — ABNORMAL LOW (ref 3.8–4.8)
Alkaline Phosphatase: 68 IU/L (ref 48–121)
BUN/Creatinine Ratio: 26 (ref 12–28)
BUN: 21 mg/dL (ref 8–27)
Bilirubin Total: 0.7 mg/dL (ref 0.0–1.2)
CO2: 21 mmol/L (ref 20–29)
Calcium: 9.2 mg/dL (ref 8.7–10.3)
Chloride: 107 mmol/L — ABNORMAL HIGH (ref 96–106)
Creatinine, Ser: 0.8 mg/dL (ref 0.57–1.00)
GFR calc Af Amer: 88 mL/min/{1.73_m2} (ref >59)
GFR calc non Af Amer: 77 mL/min/{1.73_m2} (ref >59)
Globulin, Total: 2.5 g/dL (ref 1.5–4.5)
Glucose: 117 mg/dL — ABNORMAL HIGH (ref 65–99)
Potassium: 4.3 mmol/L (ref 3.5–5.2)
Sodium: 140 mmol/L (ref 134–144)
Total Protein: 6.2 g/dL (ref 6.0–8.5)

## 2019-07-13 LAB — LIPID PANEL
Chol/HDL Ratio: 3.9 ratio (ref 0.0–4.4)
Cholesterol, Total: 165 mg/dL (ref 100–199)
HDL: 42 mg/dL (ref >39)
LDL Chol Calc (NIH): 91 mg/dL (ref 0–99)
Triglycerides: 185 mg/dL — ABNORMAL HIGH (ref 0–149)
VLDL Cholesterol Cal: 32 mg/dL (ref 5–40)

## 2019-07-13 LAB — TSH: TSH: 1.64 u[IU]/mL (ref 0.450–4.500)

## 2019-07-13 LAB — HEMOGLOBIN A1C
Est. average glucose Bld gHb Est-mCnc: 91 mg/dL
Hgb A1c MFr Bld: 4.8 % (ref 4.8–5.6)

## 2019-07-17 ENCOUNTER — Telehealth: Payer: Self-pay

## 2019-07-17 DIAGNOSIS — E782 Mixed hyperlipidemia: Secondary | ICD-10-CM

## 2019-07-17 MED ORDER — ATORVASTATIN CALCIUM 40 MG PO TABS: 40.0000 mg | ORAL_TABLET | Freq: Every day | ORAL | 3 refills | Status: DC

## 2019-07-17 NOTE — Telephone Encounter (Signed)
-----   Message from Margo Common, [place] sent at 07/13/2019  5:25 PM EDT ----- Triglycerides higher than checks in the past 5 years. Normal cholesterol. Go to Atorvastatin 40 mg qd #90 & 3 RF. Recheck levels in 6 months.

## 2019-07-17 NOTE — Telephone Encounter (Signed)
Patient advised of mammogram results.

## 2019-07-17 NOTE — Telephone Encounter (Signed)
-----   Message from Margo Common, [place] sent at 07/13/2019  5:23 PM EDT ----- Normal mammograms without signs of cancer. Repeat mammograms in a year.

## 2019-07-17 NOTE — Telephone Encounter (Signed)
Patient advised of lab results and medication refill will be sent to patient's pharmacy.

## 2019-07-26 DIAGNOSIS — H40019 Open angle with borderline findings, low risk, unspecified eye: Secondary | ICD-10-CM | POA: Diagnosis not present

## 2019-08-07 DIAGNOSIS — Z1211 Encounter for screening for malignant neoplasm of colon: Secondary | ICD-10-CM | POA: Diagnosis not present

## 2019-08-08 LAB — COLOGUARD: Cologuard: POSITIVE — AB

## 2019-08-15 LAB — COLOGUARD: COLOGUARD: POSITIVE — AB

## 2019-08-16 ENCOUNTER — Other Ambulatory Visit: Payer: Self-pay

## 2019-08-16 ENCOUNTER — Encounter: Payer: Self-pay | Admitting: *Deleted

## 2019-08-16 ENCOUNTER — Telehealth: Payer: Self-pay

## 2019-08-16 DIAGNOSIS — R195 Other fecal abnormalities: Secondary | ICD-10-CM

## 2019-08-16 NOTE — Telephone Encounter (Signed)
Done

## 2019-08-16 NOTE — Telephone Encounter (Signed)
Is she asking about the cologuard result?   I see it was positive. Normally this can be positive for blood in the stool, precancerous polyps or cancerous polyps. We dont know why it tested positive so we refer to GI for further evaluation and considerations of colonoscopy.   If she is agreeable we can place this referral. Does she have a preference on GI provider?

## 2019-08-16 NOTE — Telephone Encounter (Signed)
Copied from [name] [id number]. Topic: General - Other >> Aug 16, 2019  1:31 PM Karen [name] A wrote: Patient would like a callback to go over lab results

## 2019-08-16 NOTE — Telephone Encounter (Signed)
Yes, I just spoke with her.  She is aware of it being positive and I advised her that the next step is colonoscopy.  Dr Job Founds has done it in the past and she would like to go back to him.  I will go ahead and put in the order, unless you prefer something else.

## 2019-08-16 NOTE — Telephone Encounter (Signed)
Nope that is perfect. Thank you so much [place].  JB

## 2019-09-03 ENCOUNTER — Telehealth: Payer: Self-pay

## 2019-09-03 NOTE — Telephone Encounter (Signed)
-----   Message from Margo Common, [place] sent at 08/30/2019 12:03 PM EDT ----- Cologuard test is positive. Patient needs referral to a gastroenterologist for a colonoscopy.

## 2019-09-03 NOTE — Telephone Encounter (Signed)
Patient had already been advised and we have gotten her set up with Dr Bary Castilla (her choice) for colonoscopy.

## 2019-09-11 ENCOUNTER — Telehealth: Payer: Self-pay | Admitting: Physician Assistant

## 2019-09-11 DIAGNOSIS — R002 Palpitations: Secondary | ICD-10-CM | POA: Diagnosis not present

## 2019-09-11 DIAGNOSIS — R195 Other fecal abnormalities: Secondary | ICD-10-CM | POA: Diagnosis not present

## 2019-09-11 DIAGNOSIS — I4891 Unspecified atrial fibrillation: Secondary | ICD-10-CM | POA: Diagnosis not present

## 2019-09-11 NOTE — Telephone Encounter (Signed)
Can we reach out to patient?  She was diagnosed with new atrial fibrillation when being evaluated by Dr. Bary Castilla for her colonoscopy.   She does have appt with Dr. Ubaldo Glassing for 09/19/19 already scheduled by Dr. Bary Castilla.  We can offer her an appt if she would like to come in and discuss further and [place] be able to make sure she is doing ok before she sees Dr. Ubaldo Glassing on 09/19/19

## 2019-09-12 NOTE — Telephone Encounter (Signed)
Ms Pauls states she is doing okay and will wait to see what Dr. Ubaldo Glassing says at the appointment.  She will call back to schedule an appointment.   Thanks,   -Mickel Baas

## 2019-09-19 DIAGNOSIS — I480 Paroxysmal atrial fibrillation: Secondary | ICD-10-CM | POA: Diagnosis not present

## 2019-09-27 DIAGNOSIS — I480 Paroxysmal atrial fibrillation: Secondary | ICD-10-CM | POA: Diagnosis not present

## 2019-10-08 DIAGNOSIS — I480 Paroxysmal atrial fibrillation: Secondary | ICD-10-CM | POA: Diagnosis not present

## 2019-10-11 ENCOUNTER — Other Ambulatory Visit: Payer: Self-pay | Admitting: General Surgery

## 2019-10-11 NOTE — Progress Notes (Signed)
Subjective:     Patient ID: Karen [name] is a 69 y.o. female.  HPI  The following portions of the patient's history were reviewed and updated as appropriate.  This a new patient is here today for: office visit. Here for evaluation of positive cologuard referred by Karen Malling PA. She states that 3 years ago she had a negative Cologuard test. She states that she does have loose bloody stools after she eats fried foods. The patient reports a normal colonoscopy over 10 years ago.  Review of Systems  Constitutional: Negative for chills and fever.  Respiratory: Negative for cough.   Gastrointestinal: Positive for blood in stool. Negative for constipation and diarrhea.        Chief Complaint  Patient presents with   New Patient    positive cologuard referred by Karen Malling PA     BP 122/76    Pulse 91    Temp 36.7 C (98 F)    Ht 160 cm (5\' 3" )    Wt (!) 137.4 kg (303 lb)    SpO2 97%    BMI 53.67 kg/m       Past Medical History:  Diagnosis Date   Anemia    Hyperlipidemia    Positive colorectal cancer screening using Cologuard test 08/07/2019   Vitamin D deficiency           Past Surgical History:  Procedure Laterality Date   ABDOMINAL HYSTERECTOMY     CHOLECYSTECTOMY  1987   COLONOSCOPY  2005   Dr Bary Castilla   history of gastroplasty  1987   history of hernia repair                OB History    Gravida  0   Para  0   Term  0   Preterm  0   AB  0   Living  0     SAB  0   TAB  0   Ectopic  0   Molar  0   Multiple  0   Live Births  0       Obstetric Comments  Age at first period 45         Social History     Socioeconomic History   Marital status: Married    Spouse name: Not on file   Number of children: Not on file   Years of education: Not on file   Highest education level: Not on file  Occupational History   Not on file  Tobacco Use   Smoking status: Never Smoker    Smokeless tobacco: Never Used  Substance and Sexual Activity   Alcohol use: Yes    Comment: occasional   Drug use: Never   Sexual activity: Not on file  Other Topics Concern   Not on file  Social History Narrative   Not on file   Social Determinants of Health      Financial Resource Strain:    Difficulty of Paying Living Expenses:   Food Insecurity:    Worried About [place] in the Last Year:    Arboriculturist in the Last Year:   Transportation Needs:    Film/video editor (Medical):    Lack of Transportation (Non-Medical):             Allergies  Allergen Reactions   Tetanus Toxoid Fluid Other (See Comments)    Multiple joint pains    Current Medications  Current Outpatient Medications  Medication Sig Dispense Refill   aspirin 81 MG EC tablet Take 81 mg by mouth every other day     aspirin/acetaminophen/caffeine (EXCEDRIN MIGRAINE ORAL) Take by mouth as needed     atorvastatin (LIPITOR) 40 MG tablet Take 40 mg by mouth once daily     b complex vitamins capsule Take 1 capsule by mouth once daily     calcium carbonate-vitamin D3 (OS-CAL 500+D) 500 mg(1,250mg ) -200 unit tablet Take 1 tablet by mouth once daily     cholecalciferol (VITAMIN D3) 1000 unit capsule Take 1,000 Units by mouth once daily     multivit-min/iron/folic acid/K (ADULTS MULTIVITAMIN ORAL) Take by mouth once daily     omega 3-dha-epa-fish oil 600 mg-216 mg- 324 mg-1,200 mg CpDR Take by mouth once daily     timoloL maleate (TIMOPTIC) 0.25 % ophthalmic solution Place 1 drop into both eyes once daily     No current facility-administered medications for this visit.           Family History  Problem Relation Age of Onset   High blood pressure (Hypertension) Mother    Cancer Mother 86       bladder   Arthritis Mother    Depression Mother    Heart disease Father    Breast cancer Sister 11   Pancreatic cancer Brother  48   Hyperlipidemia (Elevated cholesterol) Brother    High blood pressure (Hypertension) Brother    Heart disease Maternal Grandmother    Stroke Maternal Grandmother    Cancer Paternal Grandfather        pancreatic   Breast cancer Maternal Aunt    Colon cancer Neg Hx          Objective:   Physical Exam Constitutional:      Appearance: Normal appearance.  Cardiovascular:     Rate and Rhythm: Tachycardia present. Rhythm irregular.     Pulses: Normal pulses.     Heart sounds: Normal heart sounds.     Comments: Heart rate was variable on pulse oximeter and auscultation. Pulmonary:     Effort: Pulmonary effort is normal.     Breath sounds: Normal breath sounds.  Musculoskeletal:     Cervical back: Neck supple.  Skin:    General: Skin is warm and dry.  Neurological:     Mental Status: She is alert and oriented to person, place, and time.  Psychiatric:        Mood and Affect: Mood normal.        Behavior: Behavior normal.    Labs and Radiology:   Laboratory review from PCP: Positive Cologuard on July 13, 2019.  CBC of the same date showed hemoglobin of 12.7, unchanged from 1 year ago.  MCV minimally elevated at 101.  White blood cell count 5200.  Platelet count 214,000.  Normal differential.  Comprehensive metabolic panel notable for a minimal elevation of the serum chloride at 107, blood sugar at 117.  Normal renal function with a creatinine of 0.8.  Normal sodium and potassium at 140 and 4.3 respectively.  TSH level was normal at 1.64 at the time of testing on July 12, 2018.  Hemoglobin A1c normal at 4.8 at that same time.      Assessment:     Candidate for colonoscopy to evaluate positive Cologuard test.  New history of what clinically appears to be atrial fibrillation.  Regular sinus rhythm reported on the June 2021 exam with Karen Natal, PA.    Plan:  The patient was sent to cardiology and had an ECG completed.  This showed atrial  fibrillation with a rapid ventricular response.  Could not rule out anterior infarct, age undetermined.  Arrangements have been made for cardiology evaluation on September 19, 2019 with Karen Bill, MD.  Results of today's ECG forwarded to her PCP, Karen Abed, PA.  Once the cardiac issue is resolved, she is a candidate for colonoscopy.  If there is no indication for emergent anticoagulation, it would be ideal with rate control to complete her colonoscopy prior to the initiation of anticoagulation.  The procedure was reviewed with the patient including the risks associated with bleeding and perforation.   Entered by Karie Fetch, RN, acting as a scribe for Dr. Hervey Ard, MD.  The documentation recorded by the scribe accurately reflects the service I personally performed and the decisions made by me.   Robert Bellow, MD FACS

## 2019-10-13 ENCOUNTER — Other Ambulatory Visit: Payer: Self-pay | Admitting: Family Medicine

## 2019-10-13 DIAGNOSIS — M1711 Unilateral primary osteoarthritis, right knee: Secondary | ICD-10-CM

## 2019-10-13 NOTE — Telephone Encounter (Signed)
Requested Prescriptions  Pending Prescriptions Disp Refills  . meloxicam (MOBIC) 15 MG tablet [Pharmacy Med Name: MELOXICAM 15 MG TABLET] 60 tablet 0    Sig: TAKE 1 TABLET BY MOUTH EVERY DAY     Analgesics:  COX2 Inhibitors Passed - 10/13/2019 10:21 AM      Passed - HGB in normal range and within 360 days    Hemoglobin  Date Value Ref Range Status  07/12/2019 12.7 11.1 - 15.9 g/dL Final         Passed - Cr in normal range and within 360 days    Creatinine, Ser  Date Value Ref Range Status  07/12/2019 0.80 0.57 - 1.00 mg/dL Final         Passed - Patient is not pregnant      Passed - Valid encounter within last 12 months    Recent Outpatient Visits          3 months ago Annual physical exam   [facility], Vickki Muff, [place]   1 year ago [place], Vickki Muff, [place]   1 year ago Annual physical exam   [facility], [place]ickki Muff, [place]   1 year ago Mixed hyperlipidemia   [facility], [place]ki Muff, [place]   2 years ago Annual physical exam   [facility], [place], [place]

## 2019-10-15 ENCOUNTER — Other Ambulatory Visit: Payer: Self-pay

## 2019-10-15 ENCOUNTER — Other Ambulatory Visit
Admission: RE | Admit: 2019-10-15 | Discharge: 2019-10-15 | Disposition: A | Discharge status: Home / Self Care | Payer: PPO | Source: Ambulatory Visit | Attending: General Surgery | Admitting: General Surgery

## 2019-10-15 DIAGNOSIS — Z20822 Contact with and (suspected) exposure to covid-19: Secondary | ICD-10-CM | POA: Diagnosis not present

## 2019-10-15 DIAGNOSIS — Z01812 Encounter for preprocedural laboratory examination: Secondary | ICD-10-CM | POA: Insufficient documentation

## 2019-10-15 LAB — SARS CORONAVIRUS 2 (TAT 6-24 HRS): SARS Coronavirus 2: NEGATIVE

## 2019-10-17 ENCOUNTER — Ambulatory Visit: Payer: PPO | Admitting: Anesthesiology

## 2019-10-17 ENCOUNTER — Encounter
Admission: RE | Disposition: A | Discharge status: Home / Self Care | Payer: Self-pay | Source: Home / Self Care | Attending: General Surgery

## 2019-10-17 ENCOUNTER — Other Ambulatory Visit: Payer: Self-pay

## 2019-10-17 ENCOUNTER — Ambulatory Visit
Admission: RE | Admit: 2019-10-17 | Discharge: 2019-10-17 | Disposition: A | Discharge status: Home / Self Care | Payer: PPO | Attending: General Surgery | Admitting: General Surgery

## 2019-10-17 DIAGNOSIS — E785 Hyperlipidemia, unspecified: Secondary | ICD-10-CM | POA: Diagnosis not present

## 2019-10-17 DIAGNOSIS — Z7982 Long term (current) use of aspirin: Secondary | ICD-10-CM | POA: Insufficient documentation

## 2019-10-17 DIAGNOSIS — K5790 Diverticulosis of intestine, part unspecified, without perforation or abscess without bleeding: Secondary | ICD-10-CM | POA: Diagnosis not present

## 2019-10-17 DIAGNOSIS — K5730 Diverticulosis of large intestine without perforation or abscess without bleeding: Secondary | ICD-10-CM | POA: Insufficient documentation

## 2019-10-17 DIAGNOSIS — R195 Other fecal abnormalities: Secondary | ICD-10-CM | POA: Insufficient documentation

## 2019-10-17 DIAGNOSIS — Z79899 Other long term (current) drug therapy: Secondary | ICD-10-CM | POA: Insufficient documentation

## 2019-10-17 HISTORY — PX: COLONOSCOPY WITH PROPOFOL: SHX5780

## 2019-10-17 SURGERY — COLONOSCOPY WITH PROPOFOL
Anesthesia: General

## 2019-10-17 MED ORDER — SODIUM CHLORIDE 0.9 % IV SOLN
INTRAVENOUS | Status: DC
  Administered 2019-10-17: 20 mL/h via INTRAVENOUS

## 2019-10-17 MED ORDER — PROPOFOL 500 MG/50ML IV EMUL
INTRAVENOUS | Status: DC | PRN
  Administered 2019-10-17: 150 ug/kg/min via INTRAVENOUS

## 2019-10-17 MED ORDER — PROPOFOL 10 MG/ML IV BOLUS
INTRAVENOUS | Status: DC | PRN
  Administered 2019-10-17: 80 mg via INTRAVENOUS

## 2019-10-17 MED ORDER — LIDOCAINE HCL (CARDIAC) PF 100 MG/5ML IV SOSY
PREFILLED_SYRINGE | INTRAVENOUS | Status: DC | PRN
  Administered 2019-10-17: 30 mg via INTRAVENOUS

## 2019-10-17 MED ORDER — PROPOFOL 500 MG/50ML IV EMUL
INTRAVENOUS | Status: AC
  Filled 2019-10-17: qty 50

## 2019-10-17 NOTE — Anesthesia Preprocedure Evaluation (Signed)
Anesthesia Evaluation  Patient identified by MRN, date of birth, ID band Patient awake    Reviewed: Allergy & Precautions, NPO status , Patient's Chart, lab work & pertinent test results  History of Anesthesia Complications Negative for: history of anesthetic complications  Airway Mallampati: III  TM Distance: >3 FB Neck ROM: Full    Dental  (+) Upper Dentures   Pulmonary neg pulmonary ROS, neg sleep apnea, neg COPD,    breath sounds clear to auscultation- rhonchi (-) wheezing      Cardiovascular (-) hypertension(-) CAD, (-) Past MI, (-) Cardiac Stents and (-) CABG  Rhythm:Regular Rate:Normal - Systolic murmurs and - Diastolic murmurs    Neuro/Psych  Headaches, neg Seizures Anxiety    GI/Hepatic negative GI ROS, Neg liver ROS,   Endo/Other  negative endocrine ROSneg diabetes  Renal/GU negative Renal ROS     Musculoskeletal negative musculoskeletal ROS (+)   Abdominal (+) + obese,   Peds  Hematology  (+) anemia ,   Anesthesia Other Findings Past Medical History: No date: Anemia No date: Hyperlipidemia No date: Vitamin D deficiency   Reproductive/Obstetrics                             Anesthesia Physical Anesthesia Plan  ASA: II  Anesthesia Plan: General   Post-op Pain Management:    Induction: Intravenous  PONV Risk Score and Plan: 2 and Propofol infusion  Airway Management Planned: Natural Airway  Additional Equipment:   Intra-op Plan:   Post-operative Plan:   Informed Consent: I have reviewed the patients History and Physical, chart, labs and discussed the procedure including the risks, benefits and alternatives for the proposed anesthesia with the patient or authorized representative who has indicated his/her understanding and acceptance.     Dental advisory given  Plan Discussed with: CRNA and Anesthesiologist  Anesthesia Plan Comments:         Anesthesia  Quick Evaluation

## 2019-10-17 NOTE — H&P (Signed)
Karen [name] [id number] 13-Jul-1950     HPI:  History of positive Cologuard test. Tolerated prep well.   Medications Prior to Admission  Medication Sig Dispense Refill Last Dose  . aspirin-acetaminophen-caffeine (EXCEDRIN MIGRAINE) 250-250-65 MG tablet Take by mouth as needed for headache.   10/16/2019 at Unknown time  . atorvastatin (LIPITOR) 40 MG tablet Take 1 tablet (40 mg total) by mouth daily. 90 tablet 3 10/16/2019 at Unknown time  . b complex vitamins capsule Take 1 capsule by mouth daily.    10/16/2019 at Unknown time  . Calcium Carbonate (CALCIUM 600 PO) Take by mouth daily.   10/16/2019 at Unknown time  . cholecalciferol (VITAMIN D) 1000 UNITS tablet Take 1,000 Units by mouth daily.    10/16/2019 at Unknown time  . meloxicam (MOBIC) 15 MG tablet TAKE 1 TABLET BY MOUTH EVERY DAY 60 tablet 0 10/16/2019 at Unknown time  . MULTIPLE VITAMIN PO Take by mouth daily.   10/16/2019 at Unknown time  . Omega-3 Fatty Acids (FISH OIL) 1000 MG CAPS Take by mouth daily.    10/16/2019 at Unknown time  . SUMAtriptan (IMITREX) 50 MG tablet Take 1 tablet by mouth as needed.   10/16/2019 at Unknown time  . timolol (TIMOPTIC) 0.25 % ophthalmic solution Place 1 drop into both eyes daily.   99 10/16/2019 at Unknown time  . aspirin (ASPIRIN ADULT LOW DOSE) 81 MG EC tablet Take 81 mg by mouth every other day. Swallow whole. (Patient not taking: Reported on 10/17/2019)   Completed Course at Unknown time   Allergies  Allergen Reactions  . Tetanus Toxoid     Multiple joint pains.   Past Medical History:  Diagnosis Date  . Anemia   . Hyperlipidemia   . Vitamin D deficiency    Past Surgical History:  Procedure Laterality Date  . ABDOMINAL HYSTERECTOMY    . CHOLECYSTECTOMY    . CHOLECYSTECTOMY  1987  . GASTROPLASTY  1987  . HERNIA REPAIR     Social History   Socioeconomic History  . Marital status: Married    Spouse name: Not on file  . Number of children: 0  . Years of education: Not on file  . Highest  education level: High school graduate  Occupational History  . Occupation: retired  Tobacco Use  . Smoking status: Never Smoker  . Smokeless tobacco: Never Used  Vaping Use  . Vaping Use: Never used  Substance and Sexual Activity  . Alcohol use: Yes    Comment: rarely Liqueur  . Drug use: No  . Sexual activity: Yes    Birth control/protection: Post-menopausal  Other Topics Concern  . Not on file  Social History Narrative  . Not on file   Social Determinants of Health   Financial Resource Strain: Low Risk   . Difficulty of Paying Living Expenses: Not hard at all  Food Insecurity: No Food Insecurity  . Worried About Charity fundraiser in the Last Year: Never true  . Ran Out of Food in the Last Year: Never true  Transportation Needs: No Transportation Needs  . Lack of Transportation (Medical): No  . Lack of Transportation (Non-Medical): No  Physical Activity: Inactive  . Days of Exercise per Week: 0 days  . Minutes of Exercise per Session: 0 min  Stress: No Stress Concern Present  . Feeling of Stress : Not at all  Social Connections: Moderately Integrated  . Frequency of Communication with Friends and Family: More than three times a week  .  Frequency of Social Gatherings with Friends and Family: Once a week  . Attends Religious Services: Never  . Active Member of Clubs or Organizations: Yes  . Attends Archivist Meetings: More than 4 times per year  . Marital Status: Married  Human resources officer Violence: Not At Risk  . Fear of Current or Ex-Partner: No  . Emotionally Abused: No  . Physically Abused: No  . Sexually Abused: No   Social History   Social History Narrative  . Not on file     ROS: Negative.     PE: HEENT: Negative. Lungs: Clear. Cardio: RR.   Assessment/Plan:  Proceed with planned endoscopy.  Forest Gleason Karen [name] 10/17/2019

## 2019-10-17 NOTE — Transfer of Care (Signed)
Immediate Anesthesia Transfer of Care Note  Patient: Karen [name]  Procedure(s) Performed: Procedure(s): COLONOSCOPY WITH PROPOFOL (N/A)  Patient Location: PACU and Endoscopy Unit  Anesthesia Type:General  Level of Consciousness: sedated  Airway & Oxygen Therapy: Patient Spontanous Breathing and Patient connected to nasal cannula oxygen  Post-op Assessment: Report given to RN and Post -op Vital signs reviewed and stable  Post vital signs: Reviewed and stable  Last Vitals:  Vitals:   10/17/19 1016 10/17/19 1110  BP: (!) 158/88 (!) 107/47  Pulse: 74 61  Resp: 20 16  Temp: 37.6 C 37.6 C  SpO2: 29% 52%    Complications: No apparent anesthesia complications

## 2019-10-17 NOTE — Anesthesia Procedure Notes (Signed)
Date/Time: 10/17/2019 10:37 AM Performed by: Doreen Salvage, CRNA Pre-anesthesia Checklist: Patient identified, Emergency Drugs available, Suction available and Patient being monitored Patient Re-evaluated:Patient Re-evaluated prior to induction Oxygen Delivery Method: Nasal cannula Induction Type: IV induction Dental Injury: Teeth and Oropharynx as per pre-operative assessment  Comments: Nasal cannula with etCO2 monitoring

## 2019-10-17 NOTE — Op Note (Addendum)
[facility] Patient Name: Karen [name] Procedure Date: 10/17/2019 10:33 AM MRN: [id number] Account #: [id number] Date of Birth: 09/09/1950 Admit Type: Outpatient Age: 69 Room: [facility] ENDO ROOM 1 Gender: Female Note Status: Finalized Procedure:             Colonoscopy Indications:           Positive Cologuard test Providers:             Robert Bellow, MD Referring MD:          Vickki Muff. Chrismon, MD (Referring MD) Medicines:             Monitored Anesthesia Care Complications:         No immediate complications. Procedure:             Pre-Anesthesia Assessment:                        - Prior to the procedure, a History and Physical was                         performed, and patient medications, allergies and                         sensitivities were reviewed. The patient's tolerance                         of previous anesthesia was reviewed.                        - The risks and benefits of the procedure and the                         sedation options and risks were discussed with the                         patient. All questions were answered and informed                         consent was obtained.                        After obtaining informed consent, the colonoscope was                         passed under direct vision. Throughout the procedure,                         the patient's blood pressure, pulse, and oxygen                         saturations were monitored continuously. The                         Colonoscope was introduced through the anus and                         advanced to the the cecum, identified by appendiceal                         orifice and ileocecal valve.  The colonoscopy was                         performed without difficulty. The patient tolerated                         the procedure well. The quality of the bowel                         preparation was excellent. Findings:      The entire examined  colon appeared normal on direct and retroflexion       views.      Multiple medium-mouthed diverticula were found in the sigmoid colon and       descending colon. Impression:            - The entire examined colon is normal on direct and                         retroflexion views.                        - No specimens collected. Recommendation:        - Discharge patient to home (via wheelchair). Procedure Code(s):     --- Professional ---                        [phone], Colonoscopy, flexible; diagnostic, including                         collection of specimen(s) by brushing or washing, when                         performed (separate procedure) Diagnosis Code(s):     --- Professional ---                        R19.5, Other fecal abnormalities CPT copyright 2019 American Medical Association. All rights reserved. The codes documented in this report are preliminary and upon coder review may  be revised to meet current compliance requirements. Robert Bellow, MD 10/17/2019 11:03:43 AM This report has been signed electronically. Number of Addenda: 0 Note Initiated On: 10/17/2019 10:33 AM Scope Withdrawal Time: 0 hours 9 minutes 4 seconds  Total Procedure Duration: 0 hours 16 minutes 46 seconds  Estimated Blood Loss:  Estimated blood loss: none.      [facility]

## 2019-10-17 NOTE — Anesthesia Postprocedure Evaluation (Signed)
Anesthesia Post Note  Patient: Karen [name]  Procedure(s) Performed: COLONOSCOPY WITH PROPOFOL (N/A )  Patient location during evaluation: Endoscopy Anesthesia Type: General Level of consciousness: awake and alert and oriented Pain management: pain level controlled Vital Signs Assessment: post-procedure vital signs reviewed and stable Respiratory status: spontaneous breathing, nonlabored ventilation and respiratory function stable Cardiovascular status: blood pressure returned to baseline and stable Postop Assessment: no signs of nausea or vomiting Anesthetic complications: no   No complications documented.   Last Vitals:  Vitals:   10/17/19 1110 10/17/19 1120  BP: (!) 107/47 (!) 120/99  Pulse: 61 (!) 57  Resp: 16 16  Temp: 37.6 C   SpO2: 99% 100%    Last Pain:  Vitals:   10/17/19 1120  TempSrc:   PainSc: 0-No pain                 Nikalas Bramel

## 2019-10-18 ENCOUNTER — Encounter: Payer: Self-pay | Admitting: General Surgery

## 2019-11-21 ENCOUNTER — Other Ambulatory Visit: Payer: Self-pay | Admitting: Family Medicine

## 2019-11-21 DIAGNOSIS — M1711 Unilateral primary osteoarthritis, right knee: Secondary | ICD-10-CM

## 2019-12-26 ENCOUNTER — Other Ambulatory Visit: Payer: Self-pay

## 2019-12-26 ENCOUNTER — Ambulatory Visit (INDEPENDENT_AMBULATORY_CARE_PROVIDER_SITE_OTHER): Payer: PPO

## 2019-12-26 DIAGNOSIS — Z23 Encounter for immunization: Secondary | ICD-10-CM

## 2020-01-08 DIAGNOSIS — I480 Paroxysmal atrial fibrillation: Secondary | ICD-10-CM | POA: Diagnosis not present

## 2020-01-08 DIAGNOSIS — E785 Hyperlipidemia, unspecified: Secondary | ICD-10-CM | POA: Diagnosis not present

## 2020-01-08 DIAGNOSIS — Z8673 Personal history of transient ischemic attack (TIA), and cerebral infarction without residual deficits: Secondary | ICD-10-CM | POA: Diagnosis not present

## 2020-01-19 ENCOUNTER — Other Ambulatory Visit: Payer: Self-pay | Admitting: Family Medicine

## 2020-01-19 DIAGNOSIS — M1711 Unilateral primary osteoarthritis, right knee: Secondary | ICD-10-CM

## 2020-01-30 DIAGNOSIS — H40003 Preglaucoma, unspecified, bilateral: Secondary | ICD-10-CM | POA: Diagnosis not present

## 2020-02-01 ENCOUNTER — Other Ambulatory Visit: Payer: Self-pay | Admitting: Family Medicine

## 2020-02-01 DIAGNOSIS — M1711 Unilateral primary osteoarthritis, right knee: Secondary | ICD-10-CM

## 2020-04-09 NOTE — Progress Notes (Addendum)
Subjective:   Karen [name] is a 70 y.o. female who presents for Medicare Annual (Subsequent) preventive examination.  I connected with [name] today by telephone and verified that I am speaking with the correct person using two identifiers. Location patient: home Location provider: work Persons participating in the virtual visit: patient, provider.   I discussed the limitations, risks, security and privacy concerns of performing an evaluation and management service by telephone and the availability of in person appointments. I also discussed with the patient that there may be a patient responsible charge related to this service. The patient expressed understanding and verbally consented to this telephonic visit.    Interactive audio and video telecommunications were attempted between this provider and patient, however failed, due to patient having technical difficulties OR patient did not have access to video capability.  We continued and completed visit with audio only.   Review of Systems    N/A  Cardiac Risk Factors include: advanced age (>36men, >64 women);obesity (BMI >30kg/m2);dyslipidemia     Objective:    There were no vitals filed for this visit. There is no height or weight on file to calculate BMI.  Advanced Directives 04/10/2020 10/17/2019 04/05/2019 02/05/2016 10/24/2014 06/17/2014  Does Patient Have a Medical Advance Directive? No No No No No No  Would patient like information on creating a medical advance directive? No - Patient declined - No - Patient declined - - -    Current Medications (verified) Outpatient Encounter Medications as of 04/10/2020  Medication Sig   aspirin-acetaminophen-caffeine (EXCEDRIN MIGRAINE) 250-250-65 MG tablet Take by mouth as needed for headache.   atorvastatin (LIPITOR) 40 MG tablet Take 1 tablet (40 mg total) by mouth daily.   b complex vitamins capsule Take 1 capsule by mouth daily.    Calcium Carbonate (CALCIUM 600 PO) Take by mouth  daily.   cholecalciferol (VITAMIN D) 1000 UNITS tablet Take 1,000 Units by mouth daily.    diltiazem (TIAZAC) 120 MG 24 hr capsule Take 120 mg by mouth daily.   meloxicam (MOBIC) 15 MG tablet TAKE 1 TABLET BY MOUTH EVERY DAY   MULTIPLE VITAMIN PO Take by mouth daily.   Omega-3 Fatty Acids (FISH OIL) 1000 MG CAPS Take by mouth daily.    SUMAtriptan (IMITREX) 50 MG tablet Take 1 tablet by mouth as needed.   aspirin 81 MG EC tablet Take 81 mg by mouth every other day. Swallow whole. (Patient not taking: No sig reported)   timolol (TIMOPTIC) 0.25 % ophthalmic solution Place 1 drop into both eyes daily.  (Patient not taking: Reported on 04/10/2020)   No facility-administered encounter medications on file as of 04/10/2020.    Allergies (verified) Tetanus toxoid   History: Past Medical History:  Diagnosis Date   A-fib ([place])    Anemia    Hyperlipidemia    Vitamin D deficiency    Past Surgical History:  Procedure Laterality Date   ABDOMINAL HYSTERECTOMY     CHOLECYSTECTOMY     CHOLECYSTECTOMY  1987   COLONOSCOPY WITH PROPOFOL N/A 10/17/2019   Procedure: COLONOSCOPY WITH PROPOFOL;  Surgeon: Robert Bellow, MD;  Location: [facility] ENDOSCOPY;  Service: Endoscopy;  Laterality: N/A;   GASTROPLASTY  1987   HERNIA REPAIR     Family History  Problem Relation Age of Onset   Hypertension Mother    Cancer Mother    Arthritis Mother    Depression Mother    Heart disease Father    Breast cancer Sister  Pancreatic cancer Brother    Cancer Paternal Grandfather    Hyperlipidemia Brother    Hypertension Brother    Heart disease Maternal Grandmother    Stroke Maternal Grandmother    Breast cancer Maternal Aunt    Social History   Socioeconomic History   Marital status: Married    Spouse name: Not on file   Number of children: 0   Years of education: Not on file   Highest education level: High school graduate  Occupational History   Occupation: retired  Tobacco Use   Smoking status:  Never Smoker   Smokeless tobacco: Never Used  Scientific laboratory technician Use: Never used  Substance and Sexual Activity   Alcohol use: Yes    Comment: rarely Liqueur   Drug use: No   Sexual activity: Yes    Birth control/protection: Post-menopausal  Other Topics Concern   Not on file  Social History Narrative   Not on file   Social Determinants of Health   Financial Resource Strain: Low Risk    Difficulty of Paying Living Expenses: Not hard at all  Food Insecurity: No Food Insecurity   Worried About Charity fundraiser in the Last Year: Never true   [place] in the Last Year: Never true  Transportation Needs: No Transportation Needs   Lack of Transportation (Medical): No   Lack of Transportation (Non-Medical): No  Physical Activity: Inactive   Days of Exercise per Week: 0 days   Minutes of Exercise per Session: 0 min  Stress: No Stress Concern Present   Feeling of Stress : Not at all  Social Connections: Moderately Integrated   Frequency of Communication with Friends and Family: More than three times a week   Frequency of Social Gatherings with Friends and Family: More than three times a week   Attends Religious Services: Never   Marine scientist or Organizations: Yes   Attends Music therapist: More than 4 times per year   Marital Status: Married    Tobacco Counseling Counseling given: Not Answered   Clinical Intake:  Pre-visit preparation completed: Yes        Nutritional Risks: None Diabetes: No  How often do you need to have someone help you when you read instructions, pamphlets, or other written materials from your doctor or pharmacy?: 1 - Never  Diabetic? No  Interpreter Needed?: No  Information entered by :: [facility], LPN   Activities of Daily Living In your present state of health, do you have any difficulty performing the following activities: 04/10/2020  Hearing? N  Vision? N  Difficulty concentrating or making decisions?  N  Walking or climbing stairs? Y  Comment Due to knee pains.  Dressing or bathing? N  Doing errands, shopping? N  Preparing Food and eating ? N  Using the Toilet? N  In the past six months, have you accidently leaked urine? N  Do you have problems with loss of bowel control? N  Managing your Medications? N  Managing your Finances? N  Housekeeping or managing your Housekeeping? N  Some recent data might be hidden    Patient Care Team: Chrismon, Driscilla Grammes as PCP - General (Physician Assistant) Arelia Sneddon, No Name (Optometry) Ubaldo Glassing Javier Docker, MD as Consulting Physician (Cardiology) Bary Castilla Forest Gleason, MD as Consulting Physician (General Surgery) Conception Chancy, DDS as Referring Physician (Dentistry)  Indicate any recent Medical Services you may have received from other than [facility] providers in the past  year (date may be approximate).     Assessment:   This is a routine wellness examination for Legacie.  Hearing/Vision screen No exam data present  Dietary issues and exercise activities discussed: Current Exercise Habits: Home exercise routine, Type of exercise: walking, Time (Minutes): 20, Frequency (Times/Week): 3, Weekly Exercise (Minutes/Week): 60, Intensity: Mild, Exercise limited by: orthopedic condition(s)  Goals      Exercise 3x per week (30 min per time)     Recommend to exercise for 3 days a week for at least 30 minutes at a time.        Depression Screen PHQ 2/9 Scores 04/10/2020 04/05/2019 06/23/2018 11/03/2017 05/02/2017 04/29/2016 03/20/2015  PHQ - 2 Score 0 0 0 0 0 0 0  PHQ- 9 Score - - 0 - 0 0 -    Fall Risk Fall Risk  04/10/2020 04/05/2019 12/05/2018 11/03/2017 04/29/2016  Falls in the past year? 0 0 0 No No  Comment - - Emmi Telephone Survey: data to providers prior to load - -  Number falls in past yr: 0 0 - - -  Injury with Fall? 0 0 - - -    FALL RISK PREVENTION PERTAINING TO THE HOME:  Any stairs in or around the home? Yes  If so, are there any without  handrails? No  Home free of loose throw rugs in walkways, pet beds, electrical cords, etc? Yes  Adequate lighting in your home to reduce risk of falls? Yes   ASSISTIVE DEVICES UTILIZED TO PREVENT FALLS:  Life alert? No  Use of a cane, walker or w/c? No  Grab bars in the bathroom? Yes  Shower chair or bench in shower? No  Elevated toilet seat or a handicapped toilet? No    Cognitive Function: Normal cognitive status assessed by observation by this Nurse Health Advisor. No abnormalities found.       6CIT Screen 04/05/2019  What Year? 0 points  What month? 0 points  What time? 0 points  Count back from 20 0 points  Months in reverse 0 points  Repeat phrase 0 points  Total Score 0    Immunizations Immunization History  Administered Date(s) Administered   Fluad Quad(high Dose 65+) 09/28/2018, 12/26/2019   Influenza Split 11/08/2008, 02/23/2011, 11/23/2011   Influenza, High Dose Seasonal PF 11/03/2017   Influenza,inj,Quad PF,6+ Mos 11/08/2013, 10/24/2014, 10/15/2016   Influenza-Unspecified 11/08/2013   Moderna Sars-Covid-2 Vaccination 03/24/2019, 04/21/2019   PFIZER(Purple Top)SARS-COV-2 Vaccination 12/26/2019   Pneumococcal Conjugate-13 05/10/2017, 11/03/2017   Pneumococcal Polysaccharide-23 07/06/2019   Zoster 03/20/2015    TDAP status: Up to date  Flu Vaccine status: Up to date  Pneumococcal vaccine status: Up to date  Covid-19 vaccine status: Completed vaccines  Qualifies for Shingles Vaccine? Yes   Zostavax completed Yes   Shingrix Completed?: No.    Education has been provided regarding the importance of this vaccine. Patient has been advised to call insurance company to determine out of pocket expense if they have not yet received this vaccine. Advised may also receive vaccine at local pharmacy or Health Dept. Verbalized acceptance and understanding.  Screening Tests Health Maintenance  Topic Date Due   DEXA SCAN  04/15/2020   COVID-19 Vaccine (4 - Booster)  06/25/2020   MAMMOGRAM  07/12/2020   Fecal DNA (Cologuard)  08/07/2022   TETANUS/TDAP  03/19/2025   INFLUENZA VACCINE  Completed   Hepatitis C Screening  Completed   PNA vac Low Risk Adult  Completed   HPV VACCINES  Aged Out  Health Maintenance  There are no preventive care reminders to display for this patient.  Colorectal cancer screening: Type of screening: Cologuard. Completed 08/07/19. Repeat every 3 years  Mammogram status: Completed 07/13/19. Repeat every year  Bone Density status: Completed 04/16/15. Results reflect: Bone density results: OSTEOPENIA. Repeat every 5 years.  Lung Cancer Screening: (Low Dose CT Chest recommended if Age 68-80 years, 30 pack-year currently smoking OR have quit w/in 15years.) does not qualify.   Additional Screening:  Hepatitis C Screening: Up to date  Vision Screening: Recommended annual ophthalmology exams for early detection of glaucoma and other disorders of the eye. Is the patient up to date with their annual eye exam?  Yes  Who is the provider or what is the name of the office in which the patient attends annual eye exams? Dr Annamaria Helling If pt is not established with a provider, would they like to be referred to a provider to establish care? No .   Dental Screening: Recommended annual dental exams for proper oral hygiene  Community Resource Referral / Chronic Care Management: CRR required this visit?  No   CCM required this visit?  No      Plan:     I have personally reviewed and noted the following in the patient's chart:   Medical and social history Use of alcohol, tobacco or illicit drugs  Current medications and supplements Functional ability and status Nutritional status Physical activity Advanced directives List of other physicians Hospitalizations, surgeries, and ER visits in previous 12 months Vitals Screenings to include cognitive, depression, and falls Referrals and appointments  In addition, I have reviewed and  discussed with patient certain preventive protocols, quality metrics, and best practice recommendations. A written personalized care plan for preventive services as well as general preventive health recommendations were provided to patient.     Laneisha Mino [place], [place]   4/43/1540   Nurse Notes: None.  Reviewed note and plan of Nurse Health advisor. Agree with documentation and recommendations.

## 2020-04-10 ENCOUNTER — Ambulatory Visit (INDEPENDENT_AMBULATORY_CARE_PROVIDER_SITE_OTHER): Payer: PPO

## 2020-04-10 ENCOUNTER — Other Ambulatory Visit: Payer: Self-pay

## 2020-04-10 ENCOUNTER — Other Ambulatory Visit: Payer: Self-pay | Admitting: Family Medicine

## 2020-04-10 DIAGNOSIS — Z0000 Encounter for general adult medical examination without abnormal findings: Secondary | ICD-10-CM

## 2020-04-10 DIAGNOSIS — M1711 Unilateral primary osteoarthritis, right knee: Secondary | ICD-10-CM

## 2020-04-10 NOTE — Patient Instructions (Signed)
Karen [name] , Thank you for taking time to come for your Medicare Wellness Visit. I appreciate your ongoing commitment to your health goals. Please review the following plan we discussed and let me know if I can assist you in the future.   Screening recommendations/referrals: Colonoscopy: Cologuard up to date, due 07/2022 Mammogram: Done 07/2020 Bone Density: Up to date, due 04/2020 Recommended yearly ophthalmology/optometry visit for glaucoma screening and checkup Recommended yearly dental visit for hygiene and checkup  Vaccinations: Influenza vaccine: Done 12/26/19 Pneumococcal vaccine: Completed series Tdap vaccine: Up to date, due 03/2025 Shingles vaccine: Shingrix discussed. Please contact your pharmacy for coverage information.     Advanced directives: Please bring a copy of your POA (Power of Attorney) and/or Living Will to your next appointment.   Conditions/risks identified:  Recommend to exercise for 3 days a week for at least 30 minutes at a time.  Next appointment: 07/07/20 @ 10:40 AM with [name] 65 Years and Older, Female Preventive care refers to lifestyle choices and visits with your health care provider that can promote health and wellness. What does preventive care include?  A yearly physical exam. This is also called an annual well check.  Dental exams once or twice a year.  Routine eye exams. Ask your health care provider how often you should have your eyes checked.  Personal lifestyle choices, including:  Daily care of your teeth and gums.  Regular physical activity.  Eating a healthy diet.  Avoiding tobacco and drug use.  Limiting alcohol use.  Practicing safe sex.  Taking low-dose aspirin every day.  Taking vitamin and mineral supplements as recommended by your health care provider. What happens during an annual well check? The services and screenings done by your health care provider during your annual well check will  depend on your age, overall health, lifestyle risk factors, and family history of disease. Counseling  Your health care provider may ask you questions about your:  Alcohol use.  Tobacco use.  Drug use.  Emotional well-being.  Home and relationship well-being.  Sexual activity.  Eating habits.  History of falls.  Memory and ability to understand (cognition).  Work and work Statistician.  Reproductive health. Screening  You may have the following tests or measurements:  Height, weight, and BMI.  Blood pressure.  Lipid and cholesterol levels. These may be checked every 5 years, or more frequently if you are over 59 years old.  Skin check.  Lung cancer screening. You may have this screening every year starting at age 70 if you have a 30-pack-year history of smoking and currently smoke or have quit within the past 15 years.  Fecal occult blood test (FOBT) of the stool. You may have this test every year starting at age 5.  Flexible sigmoidoscopy or colonoscopy. You may have a sigmoidoscopy every 5 years or a colonoscopy every 10 years starting at age 52.  Hepatitis C blood test.  Hepatitis B blood test.  Sexually transmitted disease (STD) testing.  Diabetes screening. This is done by checking your blood sugar (glucose) after you have not eaten for a while (fasting). You may have this done every 1-3 years.  Bone density scan. This is done to screen for osteoporosis. You may have this done starting at age 78.  Mammogram. This may be done every 1-2 years. Talk to your health care provider about how often you should have regular mammograms. Talk with your health care provider about your test results,  treatment options, and if necessary, the need for more tests. Vaccines  Your health care provider may recommend certain vaccines, such as:  Influenza vaccine. This is recommended every year.  Tetanus, diphtheria, and acellular pertussis (Tdap, Td) vaccine. You may need a  Td booster every 10 years.  Zoster vaccine. You may need this after age 52.  Pneumococcal 13-valent conjugate (PCV13) vaccine. One dose is recommended after age 68.  Pneumococcal polysaccharide (PPSV23) vaccine. One dose is recommended after age 14. Talk to your health care provider about which screenings and vaccines you need and how often you need them. This information is not intended to replace advice given to you by your health care provider. Make sure you discuss any questions you have with your health care provider. Document Released: 01/24/2015 Document Revised: 09/17/2015 Document Reviewed: 10/29/2014 Elsevier Interactive Patient Education  2017 [employer] Prevention in the Home Falls can cause injuries. They can happen to people of all ages. There are many things you can do to make your home safe and to help prevent falls. What can I do on the outside of my home?  Regularly fix the edges of walkways and driveways and fix any cracks.  Remove anything that might make you trip as you walk through a door, such as a raised step or threshold.  Trim any bushes or trees on the path to your home.  Use bright outdoor lighting.  Clear any walking paths of anything that might make someone trip, such as rocks or tools.  Regularly check to see if handrails are loose or broken. Make sure that both sides of any steps have handrails.  Any raised decks and porches should have guardrails on the edges.  Have any leaves, snow, or ice cleared regularly.  Use sand or salt on walking paths during winter.  Clean up any spills in your garage right away. This includes oil or grease spills. What can I do in the bathroom?  Use night lights.  Install grab bars by the toilet and in the tub and shower. Do not use towel bars as grab bars.  Use non-skid mats or decals in the tub or shower.  If you need to sit down in the shower, use a plastic, non-slip stool.  Keep the floor dry. Clean  up any water that spills on the floor as soon as it happens.  Remove soap buildup in the tub or shower regularly.  Attach bath mats securely with double-sided non-slip rug tape.  Do not have throw rugs and other things on the floor that can make you trip. What can I do in the bedroom?  Use night lights.  Make sure that you have a light by your bed that is easy to reach.  Do not use any sheets or blankets that are too big for your bed. They should not hang down onto the floor.  Have a firm chair that has side arms. You can use this for support while you get dressed.  Do not have throw rugs and other things on the floor that can make you trip. What can I do in the kitchen?  Clean up any spills right away.  Avoid walking on wet floors.  Keep items that you use a lot in easy-to-reach places.  If you need to reach something above you, use a strong step stool that has a grab bar.  Keep electrical cords out of the way.  Do not use floor polish or wax that makes floors  slippery. If you must use wax, use non-skid floor wax.  Do not have throw rugs and other things on the floor that can make you trip. What can I do with my stairs?  Do not leave any items on the stairs.  Make sure that there are handrails on both sides of the stairs and use them. Fix handrails that are broken or loose. Make sure that handrails are as long as the stairways.  Check any carpeting to make sure that it is firmly attached to the stairs. Fix any carpet that is loose or worn.  Avoid having throw rugs at the top or bottom of the stairs. If you do have throw rugs, attach them to the floor with carpet tape.  Make sure that you have a light switch at the top of the stairs and the bottom of the stairs. If you do not have them, ask someone to add them for you. What else can I do to help prevent falls?  Wear shoes that:  Do not have high heels.  Have rubber bottoms.  Are comfortable and fit you well.  Are  closed at the toe. Do not wear sandals.  If you use a stepladder:  Make sure that it is fully opened. Do not climb a closed stepladder.  Make sure that both sides of the stepladder are locked into place.  Ask someone to hold it for you, if possible.  Clearly mark and make sure that you can see:  Any grab bars or handrails.  First and last steps.  Where the edge of each step is.  Use tools that help you move around (mobility aids) if they are needed. These include:  Canes.  Walkers.  Scooters.  Crutches.  Turn on the lights when you go into a dark area. Replace any light bulbs as soon as they burn out.  Set up your furniture so you have a clear path. Avoid moving your furniture around.  If any of your floors are uneven, fix them.  If there are any pets around you, be aware of where they are.  Review your medicines with your doctor. Some medicines can make you feel dizzy. This can increase your chance of falling. Ask your doctor what other things that you can do to help prevent falls. This information is not intended to replace advice given to you by your health care provider. Make sure you discuss any questions you have with your health care provider. Document Released: 10/24/2008 Document Revised: 06/05/2015 Document Reviewed: 02/01/2014 Elsevier Interactive Patient Education  2017 [employer].

## 2020-04-24 ENCOUNTER — Other Ambulatory Visit: Payer: Self-pay | Admitting: Family Medicine

## 2020-04-24 DIAGNOSIS — M1711 Unilateral primary osteoarthritis, right knee: Secondary | ICD-10-CM

## 2020-04-24 MED ORDER — MELOXICAM 15 MG PO TABS: 1.0000 | ORAL_TABLET | Freq: Every day | ORAL | 0 refills | Status: DC

## 2020-05-29 ENCOUNTER — Other Ambulatory Visit: Payer: Self-pay | Admitting: Family Medicine

## 2020-05-29 DIAGNOSIS — Z1231 Encounter for screening mammogram for malignant neoplasm of breast: Secondary | ICD-10-CM

## 2020-06-17 DIAGNOSIS — I480 Paroxysmal atrial fibrillation: Secondary | ICD-10-CM | POA: Diagnosis not present

## 2020-06-17 DIAGNOSIS — E785 Hyperlipidemia, unspecified: Secondary | ICD-10-CM | POA: Diagnosis not present

## 2020-06-18 ENCOUNTER — Other Ambulatory Visit: Payer: Self-pay | Admitting: Family Medicine

## 2020-06-18 DIAGNOSIS — E782 Mixed hyperlipidemia: Secondary | ICD-10-CM

## 2020-06-18 DIAGNOSIS — M1711 Unilateral primary osteoarthritis, right knee: Secondary | ICD-10-CM

## 2020-07-07 ENCOUNTER — Encounter: Payer: Self-pay | Admitting: Family Medicine

## 2020-07-07 ENCOUNTER — Other Ambulatory Visit: Payer: Self-pay

## 2020-07-07 ENCOUNTER — Ambulatory Visit (INDEPENDENT_AMBULATORY_CARE_PROVIDER_SITE_OTHER): Payer: PPO | Admitting: Family Medicine

## 2020-07-07 VITALS — BP 141/79 | HR 90 | Temp 97.2°F | Resp 18 | Ht 63.0 in | Wt 294.0 lb

## 2020-07-07 DIAGNOSIS — I480 Paroxysmal atrial fibrillation: Secondary | ICD-10-CM

## 2020-07-07 DIAGNOSIS — Z0000 Encounter for general adult medical examination without abnormal findings: Secondary | ICD-10-CM | POA: Diagnosis not present

## 2020-07-07 DIAGNOSIS — E782 Mixed hyperlipidemia: Secondary | ICD-10-CM | POA: Diagnosis not present

## 2020-07-07 NOTE — Progress Notes (Signed)
I,April Miller,acting as a Education administrator for [employer], PA-C.,have documented all relevant documentation on the behalf of Karen Murders, PA-C,as directed by  [employer], PA-C while in the presence of [employer], PA-C.   Complete physical exam   Patient: Karen [name]   DOB: Oct 14, 1950   70 y.o. Female  MRN: [id number] Visit Date: 07/07/2020  Today's healthcare provider: Vernie Murders, PA-C   Chief Complaint  Patient presents with   Annual Exam   Subjective    Karen [name] is a 70 y.o. female who presents today for a complete physical exam.  She reports consuming a general diet. The patient does not participate in regular exercise at present. She generally feels well. She reports sleeping fairly well. She does not have additional problems to discuss today.    Patient had AWV with [facility] on 04/10/2020.   Past Medical History:  Diagnosis Date   A-fib ([place])    Anemia    Hyperlipidemia    Vitamin D deficiency    Past Surgical History:  Procedure Laterality Date   ABDOMINAL HYSTERECTOMY     CHOLECYSTECTOMY     CHOLECYSTECTOMY  1987   COLONOSCOPY WITH PROPOFOL N/A 10/17/2019   Procedure: COLONOSCOPY WITH PROPOFOL;  Surgeon: Robert Bellow, MD;  Location: [facility] ENDOSCOPY;  Service: Endoscopy;  Laterality: N/A;   GASTROPLASTY  1987   HERNIA REPAIR     Social History   Socioeconomic History   Marital status: Married    Spouse name: Not on file   Number of children: 0   Years of education: Not on file   Highest education level: High school graduate  Occupational History   Occupation: retired  Tobacco Use   Smoking status: Never   Smokeless tobacco: Never  Vaping Use   Vaping Use: Never used  Substance and Sexual Activity   Alcohol use: Yes    Comment: rarely Liqueur   Drug use: No   Sexual activity: Yes    Birth control/protection: Post-menopausal  Other Topics Concern   Not on file  Social History Narrative   Not on file   Social Determinants of  Health   Financial Resource Strain: Low Risk    Difficulty of Paying Living Expenses: Not hard at all  Food Insecurity: No Food Insecurity   Worried About Charity fundraiser in the Last Year: Never true   [place] in the Last Year: Never true  Transportation Needs: No Transportation Needs   Lack of Transportation (Medical): No   Lack of Transportation (Non-Medical): No  Physical Activity: Inactive   Days of Exercise per Week: 0 days   Minutes of Exercise per Session: 0 min  Stress: No Stress Concern Present   Feeling of Stress : Not at all  Social Connections: Moderately Integrated   Frequency of Communication with Friends and Family: More than three times a week   Frequency of Social Gatherings with Friends and Family: More than three times a week   Attends Religious Services: Never   Marine scientist or Organizations: Yes   Attends Music therapist: More than 4 times per year   Marital Status: Married  Human resources officer Violence: Not At Risk   Fear of Current or Ex-Partner: No   Emotionally Abused: No   Physically Abused: No   Sexually Abused: No   Family Status  Relation Name Status   Mother  Deceased   Father  Deceased   Sister  Deceased  Brother  Deceased   PGF  Deceased   Brother  Alive   MGM  (Not Specified)   Mat Aunt  (Not Specified)   Family History  Problem Relation Age of Onset   Hypertension Mother    Cancer Mother    Arthritis Mother    Depression Mother    Heart disease Father    Breast cancer Sister    Pancreatic cancer Brother    Cancer Paternal Grandfather    Hyperlipidemia Brother    Hypertension Brother    Heart disease Maternal Grandmother    Stroke Maternal Grandmother    Breast cancer Maternal Aunt    Allergies  Allergen Reactions   Tetanus Toxoid     Multiple joint pains.    Patient Care Team: Shakim Faith, Driscilla Grammes as PCP - General (Physician Assistant) Arelia Sneddon, [place] (Optometry) Ubaldo Glassing Javier Docker, MD as Consulting Physician (Cardiology) Bary Castilla Forest Gleason, MD as Consulting Physician (General Surgery) Conception Chancy, DDS as Referring Physician (Dentistry)   Medications: Outpatient Medications Prior to Visit  Medication Sig   aspirin-acetaminophen-caffeine (EXCEDRIN MIGRAINE) [phone] MG tablet Take by mouth as needed for headache.   atorvastatin (LIPITOR) 40 MG tablet TAKE 1 TABLET BY MOUTH EVERY DAY   b complex vitamins capsule Take 1 capsule by mouth daily.    Calcium Carbonate (CALCIUM 600 PO) Take by mouth daily.   cholecalciferol (VITAMIN D) 1000 UNITS tablet Take 1,000 Units by mouth daily.    diltiazem (TIAZAC) 120 MG 24 hr capsule Take 120 mg by mouth daily.   meloxicam (MOBIC) 15 MG tablet TAKE 1 TABLET (15 MG TOTAL) BY MOUTH DAILY.   MULTIPLE VITAMIN PO Take by mouth daily.   Omega-3 Fatty Acids (FISH OIL) 1000 MG CAPS Take by mouth daily.    SUMAtriptan (IMITREX) 50 MG tablet Take 1 tablet by mouth as needed.   aspirin 81 MG EC tablet Take 81 mg by mouth every other day. Swallow whole. (Patient not taking: No sig reported)   timolol (TIMOPTIC) 0.25 % ophthalmic solution Place 1 drop into both eyes daily.  (Patient not taking: Reported on 04/10/2020)   No facility-administered medications prior to visit.    Review of Systems  All other systems reviewed and are negative.    Objective    BP (!) 141/79 (BP Location: Right Arm, Patient Position: Sitting, Cuff Size: Large)   Pulse 90   Temp (!) 97.2 F (36.2 C) (Oral)   Resp 18   Ht 5\' 3"  (1.6 m)   Wt 294 lb (133.4 kg)   SpO2 97%   BMI 52.08 kg/m    Physical Exam Constitutional:      Appearance: She is well-developed. She is obese.  HENT:     Head: Normocephalic and atraumatic.     Right Ear: External ear normal.     Left Ear: External ear normal.     Nose: Nose normal.  Eyes:     General:        Right eye: No discharge.     Conjunctiva/sclera: Conjunctivae normal.     Pupils: Pupils are equal, round,  and reactive to light.  Neck:     Thyroid: No thyromegaly.     Trachea: No tracheal deviation.  Cardiovascular:     Rate and Rhythm: Normal rate and regular rhythm.     Heart sounds: Normal heart sounds. No murmur heard. Pulmonary:     Effort: Pulmonary effort is normal. No respiratory distress.  Breath sounds: Normal breath sounds. No wheezing or rales.  Chest:     Chest wall: No tenderness.  Abdominal:     General: There is no distension.     Palpations: Abdomen is soft. There is no mass.     Tenderness: There is no abdominal tenderness. There is no guarding or rebound.  Musculoskeletal:        General: No tenderness. Normal range of motion.     Cervical back: Normal range of motion and neck supple.  Lymphadenopathy:     Cervical: No cervical adenopathy.  Skin:    General: Skin is warm and dry.     Findings: No erythema or rash.  Neurological:     Mental Status: She is alert and oriented to person, place, and time.     Cranial Nerves: No cranial nerve deficit.     Motor: No abnormal muscle tone.     Coordination: Coordination normal.     Deep Tendon Reflexes: Reflexes are normal and symmetric. Reflexes normal.  Psychiatric:        Behavior: Behavior normal.        Thought Content: Thought content normal.        Judgment: Judgment normal.     Last depression screening scores PHQ 2/9 Scores 07/07/2020 04/10/2020 04/05/2019  PHQ - 2 Score 0 0 0  PHQ- 9 Score 0 - -   Last fall risk screening Fall Risk  07/07/2020  Falls in the past year? 0  Comment -  Number falls in past yr: 0  Injury with Fall? 0  Follow up Falls evaluation completed   Last Audit-C alcohol use screening Alcohol Use Disorder Test (AUDIT) 07/07/2020  1. How often do you have a drink containing alcohol? 1  2. How many drinks containing alcohol do you have on a typical day when you are drinking? 0  3. How often do you have six or more drinks on one occasion? 0  AUDIT-C Score 1  Alcohol Brief  Interventions/Follow-up -   A score of 3 or more in women, and 4 or more in men indicates increased risk for alcohol abuse, EXCEPT if all of the points are from question 1   No results found for any visits on 07/07/20.  Assessment & Plan    Routine Health Maintenance and Physical Exam  Exercise Activities and Dietary recommendations  Goals      Exercise 3x per week (30 min per time)     Recommend to exercise for 3 days a week for at least 30 minutes at a time.          Immunization History  Administered Date(s) Administered   Fluad Quad(high Dose 65+) 09/28/2018, 12/26/2019   Influenza Split 11/08/2008, 02/23/2011, 11/23/2011   Influenza, High Dose Seasonal PF 11/03/2017   Influenza,inj,Quad PF,6+ Mos 11/08/2013, 10/24/2014, 10/15/2016   Influenza-Unspecified 11/08/2013   Moderna Sars-Covid-2 Vaccination 03/24/2019, 04/21/2019   PFIZER(Purple Top)SARS-COV-2 Vaccination 12/26/2019   Pneumococcal Conjugate-13 05/10/2017, 11/03/2017   Pneumococcal Polysaccharide-23 07/06/2019   Zoster, Live 03/20/2015    Health Maintenance  Topic Date Due   Zoster Vaccines- Shingrix (1 of 2) Never done   COVID-19 Vaccine (4 - Booster) 03/25/2020   DEXA SCAN  04/15/2020   MAMMOGRAM  07/12/2020   INFLUENZA VACCINE  08/11/2020   Fecal DNA (Cologuard)  08/07/2022   TETANUS/TDAP  03/19/2025   Hepatitis C Screening  Completed   PNA vac Low Risk Adult  Completed   HPV VACCINES  Aged Out  Discussed health benefits of physical activity, and encouraged her to engage in regular exercise appropriate for her age and condition.  1. Annual physical exam General health stable. Morbid obesity not significantly changing. Counseled regarding health maintenance and follow up with Dr. Bary Castilla as planned to assess colon with history of positive Cologuard on 08-07-19. Follow up here pending lab reports. - Hemoglobin A1c - Lipid panel - TSH - CBC w/Diff/Platelet - Comprehensive Metabolic Panel  (CMET)  2. Mixed hyperlipidemia Tolerating the Atrovastatin 40 mg qd. With severe obesity will recheck labs and must work on diet with regular exercise as tolerated. - Hemoglobin A1c - Lipid panel - TSH - CBC w/Diff/Platelet - Comprehensive Metabolic Panel (CMET)  3. Obesity, morbid, BMI 50 or higher (HCC) Weight without significant improvement. Had bariatric surgery in 1987. Recheck labs. - Hemoglobin A1c - Lipid panel - TSH - CBC w/Diff/Platelet - Comprehensive Metabolic Panel (CMET)  4. Paroxysmal atrial fibrillation ([place]) Last follow up with cardiologist (Dr. Ubaldo Glassing) on 06-17-20 and he increased the Cardizem to 120 mg BID and deferring anticoagulation therapy due to plans for colonoscopy to evaluate history of positive Cologuard on 08-07-19.    No follow-ups on file.     I, Tommy Minichiello, PA-C, have reviewed all documentation for this visit. The documentation on 07/07/20 for the exam, diagnosis, procedures, and orders are all accurate and complete.    Karen Murders, PA-C  [employer] [phone] (phone) [phone] (fax)  [place]

## 2020-07-08 LAB — CBC WITH DIFFERENTIAL/PLATELET
Basophils Absolute: 0 10*3/uL (ref 0.0–0.2)
Basos: 1 %
EOS (ABSOLUTE): 0.1 10*3/uL (ref 0.0–0.4)
Eos: 2 %
Hematocrit: 38.2 % (ref 34.0–46.6)
Hemoglobin: 12.6 g/dL (ref 11.1–15.9)
Immature Grans (Abs): 0 10*3/uL (ref 0.0–0.1)
Immature Granulocytes: 0 %
Lymphocytes Absolute: 1.4 10*3/uL (ref 0.7–3.1)
Lymphs: 25 %
MCH: 32.2 pg (ref 26.6–33.0)
MCHC: 33 g/dL (ref 31.5–35.7)
MCV: 98 fL — ABNORMAL HIGH (ref 79–97)
Monocytes Absolute: 0.5 10*3/uL (ref 0.1–0.9)
Monocytes: 9 %
Neutrophils Absolute: 3.7 10*3/uL (ref 1.4–7.0)
Neutrophils: 63 %
Platelets: 197 10*3/uL (ref 150–450)
RBC: 3.91 x10E6/uL (ref 3.77–5.28)
RDW: 11.9 % (ref 11.7–15.4)
WBC: 5.7 10*3/uL (ref 3.4–10.8)

## 2020-07-08 LAB — TSH: TSH: 1.5 u[IU]/mL (ref 0.450–4.500)

## 2020-07-08 LAB — COMPREHENSIVE METABOLIC PANEL
ALT: 11 IU/L (ref 0–32)
AST: 19 IU/L (ref 0–40)
Albumin/Globulin Ratio: 1.7 (ref 1.2–2.2)
Albumin: 4 g/dL (ref 3.8–4.8)
Alkaline Phosphatase: 74 IU/L (ref 44–121)
BUN/Creatinine Ratio: 19 (ref 12–28)
BUN: 17 mg/dL (ref 8–27)
Bilirubin Total: 0.9 mg/dL (ref 0.0–1.2)
CO2: 23 mmol/L (ref 20–29)
Calcium: 9.5 mg/dL (ref 8.7–10.3)
Chloride: 104 mmol/L (ref 96–106)
Creatinine, Ser: 0.89 mg/dL (ref 0.57–1.00)
Globulin, Total: 2.4 g/dL (ref 1.5–4.5)
Glucose: 100 mg/dL — ABNORMAL HIGH (ref 65–99)
Potassium: 4.6 mmol/L (ref 3.5–5.2)
Sodium: 143 mmol/L (ref 134–144)
Total Protein: 6.4 g/dL (ref 6.0–8.5)
eGFR: 71 mL/min/{1.73_m2} (ref >59)

## 2020-07-08 LAB — HEMOGLOBIN A1C
Est. average glucose Bld gHb Est-mCnc: 105 mg/dL
Hgb A1c MFr Bld: 5.3 % (ref 4.8–5.6)

## 2020-07-08 LAB — LIPID PANEL
Chol/HDL Ratio: 2.9 ratio (ref 0.0–4.4)
Cholesterol, Total: 141 mg/dL (ref 100–199)
HDL: 48 mg/dL (ref >39)
LDL Chol Calc (NIH): 74 mg/dL (ref 0–99)
Triglycerides: 102 mg/dL (ref 0–149)
VLDL Cholesterol Cal: 19 mg/dL (ref 5–40)

## 2020-07-25 ENCOUNTER — Other Ambulatory Visit: Payer: Self-pay

## 2020-07-25 ENCOUNTER — Ambulatory Visit
Admission: RE | Admit: 2020-07-25 | Discharge: 2020-07-25 | Disposition: A | Discharge status: Home / Self Care | Payer: PPO | Source: Ambulatory Visit | Attending: Family Medicine | Admitting: Family Medicine

## 2020-07-25 DIAGNOSIS — Z1231 Encounter for screening mammogram for malignant neoplasm of breast: Secondary | ICD-10-CM | POA: Insufficient documentation

## 2020-09-18 DIAGNOSIS — H43392 Other vitreous opacities, left eye: Secondary | ICD-10-CM | POA: Diagnosis not present

## 2020-09-19 ENCOUNTER — Other Ambulatory Visit: Payer: Self-pay | Admitting: Family Medicine

## 2020-09-19 DIAGNOSIS — E782 Mixed hyperlipidemia: Secondary | ICD-10-CM

## 2020-09-23 ENCOUNTER — Other Ambulatory Visit: Payer: Self-pay | Admitting: Family Medicine

## 2020-09-23 DIAGNOSIS — M1711 Unilateral primary osteoarthritis, right knee: Secondary | ICD-10-CM

## 2020-09-30 ENCOUNTER — Encounter: Payer: Self-pay | Admitting: Family Medicine

## 2020-11-05 ENCOUNTER — Other Ambulatory Visit: Payer: Self-pay

## 2020-11-05 ENCOUNTER — Ambulatory Visit (INDEPENDENT_AMBULATORY_CARE_PROVIDER_SITE_OTHER): Payer: PPO

## 2020-11-05 DIAGNOSIS — Z23 Encounter for immunization: Secondary | ICD-10-CM

## 2020-11-19 ENCOUNTER — Other Ambulatory Visit: Payer: Self-pay | Admitting: Family Medicine

## 2020-11-19 DIAGNOSIS — M1711 Unilateral primary osteoarthritis, right knee: Secondary | ICD-10-CM

## 2020-12-18 DIAGNOSIS — I480 Paroxysmal atrial fibrillation: Secondary | ICD-10-CM | POA: Diagnosis not present

## 2020-12-18 DIAGNOSIS — E785 Hyperlipidemia, unspecified: Secondary | ICD-10-CM | POA: Diagnosis not present

## 2021-01-19 ENCOUNTER — Ambulatory Visit: Payer: PPO | Admitting: Family Medicine

## 2021-02-12 ENCOUNTER — Ambulatory Visit: Payer: PPO | Admitting: Family Medicine

## 2021-02-12 ENCOUNTER — Encounter: Payer: Self-pay | Admitting: Physician Assistant

## 2021-02-12 ENCOUNTER — Ambulatory Visit (INDEPENDENT_AMBULATORY_CARE_PROVIDER_SITE_OTHER): Payer: PPO | Admitting: Physician Assistant

## 2021-02-12 ENCOUNTER — Other Ambulatory Visit: Payer: Self-pay

## 2021-02-12 VITALS — BP 154/85 | HR 92 | Temp 97.9°F | Wt 279.0 lb

## 2021-02-12 DIAGNOSIS — E559 Vitamin D deficiency, unspecified: Secondary | ICD-10-CM

## 2021-02-12 DIAGNOSIS — I480 Paroxysmal atrial fibrillation: Secondary | ICD-10-CM | POA: Insufficient documentation

## 2021-02-12 DIAGNOSIS — E782 Mixed hyperlipidemia: Secondary | ICD-10-CM

## 2021-02-12 DIAGNOSIS — I10 Essential (primary) hypertension: Secondary | ICD-10-CM | POA: Diagnosis not present

## 2021-02-12 DIAGNOSIS — I4811 Longstanding persistent atrial fibrillation: Secondary | ICD-10-CM | POA: Insufficient documentation

## 2021-02-12 LAB — EKG 12-LEAD

## 2021-02-12 NOTE — Assessment & Plan Note (Addendum)
In afib today, asymptomatic. Ekg, rate controlled afib. Recently f/u with cardio recommend she call and make a sooner f/u than 6 mo.

## 2021-02-12 NOTE — Progress Notes (Signed)
Established patient visit   Patient: Karen [name]   DOB: [birth year]   71 y.o. Female  MRN: [id number] Visit Date: 02/12/2021  Today's healthcare provider: [name], PA-C   Cc. Htn f/u  Subjective    HPI  Hypertension, follow-up  BP Readings from Last 3 Encounters:  02/12/21 (!) 154/85  07/07/20 (!) 141/79  10/17/19 (!) 120/99   Wt Readings from Last 3 Encounters:  02/12/21 279 lb (126.6 kg)  07/07/20 294 lb (133.4 kg)  10/17/19 (!) 303 lb (137.4 kg)     She was last seen for hypertension 6 months ago.  No changes. She reports good compliance with treatment. She is not having side effects.  She is following a Regular diet. She is not exercising. She does not smoke.  Use of agents associated with hypertension: none. Does not check outside BP Symptoms: No chest pain No chest pressure  No palpitations No syncope  No dyspnea No orthopnea  No paroxysmal nocturnal dyspnea No lower extremity edema   Pertinent labs: Lab Results  Component Value Date   CHOL 141 07/07/2020   HDL 48 07/07/2020   LDLCALC 74 07/07/2020   TRIG 102 07/07/2020   CHOLHDL 2.9 07/07/2020   Lab Results  Component Value Date   NA 143 07/07/2020   K 4.6 07/07/2020   CREATININE 0.89 07/07/2020   EGFR 71 07/07/2020   GLUCOSE 100 (H) 07/07/2020   TSH 1.500 07/07/2020     The 10-year ASCVD risk score (Arnett DK, et al., 2019) is: 13.2%   --------------------------------------------------------------------------------------------------- Medications: Outpatient Medications Prior to Visit  Medication Sig   aspirin 81 MG EC tablet Take 81 mg by mouth every other day. Swallow whole. (Patient not taking: No sig reported)   aspirin-acetaminophen-caffeine (EXCEDRIN MIGRAINE) 250-250-65 MG tablet Take by mouth as needed for headache.   atorvastatin (LIPITOR) 40 MG tablet TAKE 1 TABLET BY MOUTH EVERY DAY   b complex vitamins capsule Take 1 capsule by mouth daily.    Calcium Carbonate  (CALCIUM 600 PO) Take by mouth daily.   cholecalciferol (VITAMIN D) 1000 UNITS tablet Take 1,000 Units by mouth daily.    diltiazem (TIAZAC) 120 MG 24 hr capsule Take 120 mg by mouth daily.   meloxicam (MOBIC) 15 MG tablet TAKE 1 TABLET (15 MG TOTAL) BY MOUTH DAILY.   MULTIPLE VITAMIN PO Take by mouth daily.   Omega-3 Fatty Acids (FISH OIL) 1000 MG CAPS Take by mouth daily.    SUMAtriptan (IMITREX) 50 MG tablet Take 1 tablet by mouth as needed.   timolol (TIMOPTIC) 0.25 % ophthalmic solution Place 1 drop into both eyes daily.  (Patient not taking: Reported on 04/10/2020)   No facility-administered medications prior to visit.    Review of Systems  Constitutional:  Negative for fatigue and fever.  Respiratory:  Negative for cough and shortness of breath.   Cardiovascular:  Negative for chest pain and leg swelling.  Gastrointestinal:  Negative for abdominal pain.  Neurological:  Negative for dizziness and headaches.       Objective    BP (!) 154/85 (BP Location: Right Wrist, Patient Position: Sitting, Cuff Size: Normal)    Pulse 92    Temp 97.9 F (36.6 C) (Oral)    Wt 279 lb (126.6 kg)    SpO2 97%    BMI 49.42 kg/m    Physical Exam Constitutional:      General: She is awake.     Appearance: She is well-developed.  HENT:     Head: Normocephalic.  Eyes:     Conjunctiva/sclera: Conjunctivae normal.  Cardiovascular:     Rate and Rhythm: Normal rate. Rhythm irregular.     Heart sounds: Normal heart sounds.  Pulmonary:     Effort: Pulmonary effort is normal.     Breath sounds: Normal breath sounds.  Skin:    General: Skin is warm.  Neurological:     Mental Status: She is alert and oriented to person, place, and time.  Psychiatric:        Attention and Perception: Attention normal.        Mood and Affect: Mood normal.        Speech: Speech normal.        Behavior: Behavior is cooperative.     Results for orders placed or performed in visit on 02/12/21  EKG 12-Lead  Result  Value Ref Range   Addendum 4      Assessment & Plan     Problem List Items Addressed This Visit       Cardiovascular and Mediastinum   Paroxysmal atrial fibrillation (HCC)    In afib today, asymptomatic. Ekg, rate controlled afib. Recently f/u with cardio recommend she call and make a sooner f/u than 6 mo.       Relevant Orders   EKG 12-Lead (Completed)   Hypertension - Primary    Elevated today, 12/22 at cardio was 120/70.  Will monitor here and advised she mention to cardio was well that it is more elevated.      Relevant Orders   Comprehensive Metabolic Panel (CMET)     Other   HLD (hyperlipidemia)   Relevant Orders   Lipid panel   Comprehensive Metabolic Panel (CMET)   Avitaminosis D   Relevant Orders   Vitamin D (25 hydroxy)     Return in about 4 months (around 06/12/2021) for CPE. Please call and make an appointment with your cardiologist.     I, [name], PA-C have reviewed all documentation for this visit. The documentation on  02/12/2021 for the exam, diagnosis, procedures, and orders are all accurate and complete.    [name], PA-C  [facility] [phone] (phone) [phone] (fax)  [place]

## 2021-02-12 NOTE — Assessment & Plan Note (Signed)
Elevated today, 12/22 at cardio was 120/70.  Will monitor here and advised she mention to cardio was well that it is more elevated.

## 2021-02-14 LAB — COMPREHENSIVE METABOLIC PANEL
ALT: 14 IU/L (ref 0–32)
AST: 23 IU/L (ref 0–40)
Albumin/Globulin Ratio: 1.4 (ref 1.2–2.2)
Albumin: 4 g/dL (ref 3.8–4.8)
Alkaline Phosphatase: 71 IU/L (ref 44–121)
BUN/Creatinine Ratio: 20 (ref 12–28)
BUN: 18 mg/dL (ref 8–27)
Bilirubin Total: 1.1 mg/dL (ref 0.0–1.2)
CO2: 26 mmol/L (ref 20–29)
Calcium: 9.6 mg/dL (ref 8.7–10.3)
Chloride: 105 mmol/L (ref 96–106)
Creatinine, Ser: 0.9 mg/dL (ref 0.57–1.00)
Globulin, Total: 2.8 g/dL (ref 1.5–4.5)
Glucose: 106 mg/dL — ABNORMAL HIGH (ref 70–99)
Potassium: 4.2 mmol/L (ref 3.5–5.2)
Sodium: 145 mmol/L — ABNORMAL HIGH (ref 134–144)
Total Protein: 6.8 g/dL (ref 6.0–8.5)
eGFR: 70 mL/min/{1.73_m2} (ref >59)

## 2021-02-14 LAB — VITAMIN D 25 HYDROXY (VIT D DEFICIENCY, FRACTURES): Vit D, 25-Hydroxy: 29.2 ng/mL — ABNORMAL LOW (ref 30.0–100.0)

## 2021-02-14 LAB — LIPID PANEL
Chol/HDL Ratio: 2.8 ratio (ref 0.0–4.4)
Cholesterol, Total: 145 mg/dL (ref 100–199)
HDL: 51 mg/dL (ref >39)
LDL Chol Calc (NIH): 76 mg/dL (ref 0–99)
Triglycerides: 95 mg/dL (ref 0–149)
VLDL Cholesterol Cal: 18 mg/dL (ref 5–40)

## 2021-03-16 ENCOUNTER — Other Ambulatory Visit: Payer: Self-pay | Admitting: Family Medicine

## 2021-03-16 DIAGNOSIS — M1711 Unilateral primary osteoarthritis, right knee: Secondary | ICD-10-CM

## 2021-04-16 ENCOUNTER — Other Ambulatory Visit: Payer: Self-pay | Admitting: Physician Assistant

## 2021-04-16 DIAGNOSIS — M1711 Unilateral primary osteoarthritis, right knee: Secondary | ICD-10-CM

## 2021-05-05 ENCOUNTER — Ambulatory Visit (INDEPENDENT_AMBULATORY_CARE_PROVIDER_SITE_OTHER): Payer: PPO

## 2021-05-05 VITALS — Wt 279.0 lb

## 2021-05-05 DIAGNOSIS — Z0000 Encounter for general adult medical examination without abnormal findings: Secondary | ICD-10-CM

## 2021-05-05 NOTE — Progress Notes (Signed)
?Virtual Visit via Telephone Note ? ?I connected with  Karen [name] on 05/05/21 at 10:30 AM EDT by telephone and verified that I am speaking with the correct person using two identifiers. ? ?Location: ?Patient: home ?Provider: [facility] ?Persons participating in the virtual visit: patient/Nurse Health Advisor ?  ?I discussed the limitations, risks, security and privacy concerns of performing an evaluation and management service by telephone and the availability of in person appointments. The patient expressed understanding and agreed to proceed. ? ?Interactive audio and video telecommunications were attempted between this nurse and patient, however failed, due to patient having technical difficulties OR patient did not have access to video capability.  We continued and completed visit with audio only. ? ?Some vital signs may be absent or patient reported.  ? ?Dionisio David, LPN ? ?Subjective:  ? Karen [name] is a 71 y.o. female who presents for Medicare Annual (Subsequent) preventive examination. ? ?Review of Systems    ? ?  ? ?   ?Objective:  ?  ?There were no vitals filed for this visit. ?There is no height or weight on file to calculate BMI. ? ? ?  04/10/2020  ?  2:45 PM 10/17/2019  ? 10:14 AM 04/05/2019  ?  2:12 PM 02/05/2016  ?  5:45 PM 10/24/2014  ?  9:02 AM 06/17/2014  ? 11:45 AM  ?Advanced Directives  ?Does Patient Have a Medical Advance Directive? No No No No No No  ?Would patient like information on creating a medical advance directive? No - Patient declined  No - Patient declined     ? ? ?Current Medications (verified) ?Outpatient Encounter Medications as of 05/05/2021  ?Medication Sig  ? aspirin 81 MG EC tablet Take 81 mg by mouth every other day. Swallow whole. (Patient not taking: No sig reported)  ? aspirin-acetaminophen-caffeine (EXCEDRIN MIGRAINE) 250-250-65 MG tablet Take by mouth as needed for headache.  ? atorvastatin (LIPITOR) 40 MG tablet TAKE 1 TABLET BY MOUTH EVERY DAY  ? b complex vitamins capsule  Take 1 capsule by mouth daily.   ? Calcium Carbonate (CALCIUM 600 PO) Take by mouth daily.  ? cholecalciferol (VITAMIN D) 1000 UNITS tablet Take 1,000 Units by mouth daily.   ? diltiazem (TIAZAC) 120 MG 24 hr capsule Take 120 mg by mouth daily.  ? meloxicam (MOBIC) 15 MG tablet TAKE 1 TABLET (15 MG TOTAL) BY MOUTH DAILY.  ? MULTIPLE VITAMIN PO Take by mouth daily.  ? Omega-3 Fatty Acids (FISH OIL) 1000 MG CAPS Take by mouth daily.   ? SUMAtriptan (IMITREX) 50 MG tablet Take 1 tablet by mouth as needed.  ? timolol (TIMOPTIC) 0.25 % ophthalmic solution Place 1 drop into both eyes daily.  (Patient not taking: Reported on 04/10/2020)  ? ?No facility-administered encounter medications on file as of 05/05/2021.  ? ? ?Allergies (verified) ?Tetanus toxoid  ? ?History: ?Past Medical History:  ?Diagnosis Date  ? A-fib ([place])   ? Anemia   ? Hyperlipidemia   ? Vitamin D deficiency   ? ?Past Surgical History:  ?Procedure Laterality Date  ? ABDOMINAL HYSTERECTOMY    ? CHOLECYSTECTOMY    ? CHOLECYSTECTOMY  1987  ? COLONOSCOPY WITH PROPOFOL N/A 10/17/2019  ? Procedure: COLONOSCOPY WITH PROPOFOL;  Surgeon: Robert Bellow, MD;  Location[facility] ENDOSCOPY;  Service: Endoscopy;  Laterality: N/A;  ? GASTROPLASTY  1987  ? HERNIA REPAIR    ? ?Family History  ?Problem Relation Age of Onset  ? Hypertension Mother   ?  Cancer Mother   ? Arthritis Mother   ? Depression Mother   ? Heart disease Father   ? Breast cancer Sister   ? Pancreatic cancer Brother   ? Cancer Paternal Grandfather   ? Hyperlipidemia Brother   ? Hypertension Brother   ? Heart disease Maternal Grandmother   ? Stroke Maternal Grandmother   ? Breast cancer Maternal Aunt   ? ?Social History  ? ?Socioeconomic History  ? Marital status: Married  ?  Spouse name: Not on file  ? Number of children: 0  ? Years of education: Not on file  ? Highest education level: High school graduate  ?Occupational History  ? Occupation: retired  ?Tobacco Use  ? Smoking status: Never  ? Smokeless  tobacco: Never  ?Vaping Use  ? Vaping Use: Never used  ?Substance and Sexual Activity  ? Alcohol use: Yes  ?  Comment: rarely Liqueur  ? Drug use: No  ? Sexual activity: Yes  ?  Birth control/protection: Post-menopausal  ?Other Topics Concern  ? Not on file  ?Social History Narrative  ? Not on file  ? ?Social Determinants of Health  ? ?Financial Resource Strain: Not on file  ?Food Insecurity: Not on file  ?Transportation Needs: Not on file  ?Physical Activity: Not on file  ?Stress: Not on file  ?Social Connections: Not on file  ? ? ?Tobacco Counseling ?Counseling given: Not Answered ? ? ?Clinical Intake: ? ?Pre-visit preparation completed: Yes ? ?Pain : No/denies pain ? ?  ? ?Nutritional Risks: None ?Diabetes: No ? ?How often do you need to have someone help you when you read instructions, pamphlets, or other written materials from your doctor or pharmacy?: 1 - Never ? ?Diabetic?no ? ?Interpreter Needed?: No ? ?Information entered by :: Kirke Shaggy, LPN ? ? ?Activities of Daily Living ? ?  05/01/2021  ?  9:00 AM 04/21/2021  ?  9:59 AM  ?In your present state of health, do you have any difficulty performing the following activities:  ?Hearing? 0 0  ? 0  ?Vision? 0 0  ? 0  ?Difficulty concentrating or making decisions? 0 0  ? 0  ?Walking or climbing stairs? 1 1  ? 1  ?Dressing or bathing? 0 0  ? 0  ?Doing errands, shopping? 0 0  ? 0  ?Preparing Food and eating ? N N  ? N  ?Using the Toilet? N N  ? N  ?In the past six months, have you accidently leaked urine? N N  ? N  ?Do you have problems with loss of bowel control? Y Y  ? Y  ?Managing your Medications? N N  ? N  ?Managing your Finances? N N  ? N  ?Housekeeping or managing your Housekeeping? N N  ? N  ? ? ?Patient Care Team: ?Emelia Loron as PCP - General (Physician Assistant) ?Arelia Sneddon, [place] (Optometry) ?Teodoro Spray, MD as Consulting Physician (Cardiology) ?Robert Bellow, MD as Consulting Physician (General Surgery) ?Conception Chancy, DDS as  Referring Physician (Dentistry) ? ?Indicate any recent Medical Services you may have received from other than [facility] providers in the past year (date may be approximate). ? ?   ?Assessment:  ? This is a routine wellness examination for Sheri. ? ?Hearing/Vision screen ?No results found. ? ?Dietary issues and exercise activities discussed: ?  ? ? Goals Addressed   ?None ?  ? ?Depression Screen ? ?  02/12/2021  ?  3:57 PM 07/07/2020  ? 11:36  AM 04/10/2020  ?  2:42 PM 04/05/2019  ?  2:06 PM 06/23/2018  ? 10:45 AM 11/03/2017  ? 10:13 AM 05/02/2017  ? 11:15 AM  ?PHQ 2/9 Scores  ?PHQ - 2 Score 0 0 0 0 0 0 0  ?PHQ- 9 Score 1 0   0  0  ?  ?Fall Risk ? ?  05/01/2021  ?  9:00 AM 04/21/2021  ?  9:59 AM 02/12/2021  ?  3:56 PM 07/07/2020  ? 11:36 AM 04/10/2020  ?  2:46 PM  ?Fall Risk   ?Falls in the past year? 0 0  ? 0 0 0 0  ?Number falls in past yr:   0 0 0  ?Injury with Fall?   0 0 0  ?Follow up    Falls evaluation completed   ? ? ?FALL RISK PREVENTION PERTAINING TO THE HOME: ? ?Any stairs in or around the home? No  ?If so, are there any without handrails? No  ?Home free of loose throw rugs in walkways, pet beds, electrical cords, etc? Yes  ?Adequate lighting in your home to reduce risk of falls? Yes  ? ?ASSISTIVE DEVICES UTILIZED TO PREVENT FALLS: ? ?Life alert? No  ?Use of a cane, walker or w/c? No  ?Grab bars in the bathroom? Yes  ?Shower chair or bench in shower? No  ?Elevated toilet seat or a handicapped toilet? No  ? ?Cognitive Function:  ?  ? ?  04/05/2019  ?  2:18 PM  ?6CIT Screen  ?What Year? 0 points  ?What month? 0 points  ?What time? 0 points  ?Count back from 20 0 points  ?Months in reverse 0 points  ?Repeat phrase 0 points  ?Total Score 0 points  ? ? ?Immunizations ?Immunization History  ?Administered Date(s) Administered  ? Fluad Quad(high Dose 65+) 09/28/2018, 12/26/2019, 11/05/2020  ? Influenza Split 11/08/2008, 02/23/2011, 11/23/2011  ? Influenza, High Dose Seasonal PF 11/03/2017  ? Influenza,inj,Quad PF,6+ Mos 11/08/2013,  10/24/2014, 10/15/2016  ? Influenza-Unspecified 11/08/2013  ? Moderna Sars-Covid-2 Vaccination 03/24/2019, 04/21/2019  ? PFIZER(Purple Top)SARS-COV-2 Vaccination 12/26/2019  ? Pneumococcal Conjugate-13 05/11/18

## 2021-05-05 NOTE — Patient Instructions (Signed)
Karen [name] , ?Thank you for taking time to come for your Medicare Wellness Visit. I appreciate your ongoing commitment to your health goals. Please review the following plan we discussed and let me know if I can assist you in the future.  ? ?Screening recommendations/referrals: ?Colonoscopy: 10/17/19 ?Mammogram: 07/25/20 ?Bone Density: 04/16/15 ?Recommended yearly ophthalmology/optometry visit for glaucoma screening and checkup ?Recommended yearly dental visit for hygiene and checkup ? ?Vaccinations: ?Influenza vaccine: 11/05/20 ?Pneumococcal vaccine: 07/06/19 ?Tdap vaccine: 03/20/15 ?Shingles vaccine: Zostavax 03/20/15   ?Covid-19:03/24/19, 04/21/19, 12/26/19 ? ?Advanced directives: no ? ?Conditions/risks identified: none ? ?Next appointment: Follow up in one year for your annual wellness visit 05/10/22 @ 10am by phone ? ? ?Preventive Care 69 Years and Older, Female ?Preventive care refers to lifestyle choices and visits with your health care provider that can promote health and wellness. ?What does preventive care include? ?A yearly physical exam. This is also called an annual well check. ?Dental exams once or twice a year. ?Routine eye exams. Ask your health care provider how often you should have your eyes checked. ?Personal lifestyle choices, including: ?Daily care of your teeth and gums. ?Regular physical activity. ?Eating a healthy diet. ?Avoiding tobacco and drug use. ?Limiting alcohol use. ?Practicing safe sex. ?Taking low-dose aspirin every day. ?Taking vitamin and mineral supplements as recommended by your health care provider. ?What happens during an annual well check? ?The services and screenings done by your health care provider during your annual well check will depend on your age, overall health, lifestyle risk factors, and family history of disease. ?Counseling  ?Your health care provider may ask you questions about your: ?Alcohol use. ?Tobacco use. ?Drug use. ?Emotional well-being. ?Home and relationship  well-being. ?Sexual activity. ?Eating habits. ?History of falls. ?Memory and ability to understand (cognition). ?Work and work Statistician. ?Reproductive health. ?Screening  ?You may have the following tests or measurements: ?Height, weight, and BMI. ?Blood pressure. ?Lipid and cholesterol levels. These may be checked every 5 years, or more frequently if you are over 2 years old. ?Skin check. ?Lung cancer screening. You may have this screening every year starting at age 13 if you have a 30-pack-year history of smoking and currently smoke or have quit within the past 15 years. ?Fecal occult blood test (FOBT) of the stool. You may have this test every year starting at age 37. ?Flexible sigmoidoscopy or colonoscopy. You may have a sigmoidoscopy every 5 years or a colonoscopy every 10 years starting at age 44. ?Hepatitis C blood test. ?Hepatitis B blood test. ?Sexually transmitted disease (STD) testing. ?Diabetes screening. This is done by checking your blood sugar (glucose) after you have not eaten for a while (fasting). You may have this done every 1-3 years. ?Bone density scan. This is done to screen for osteoporosis. You may have this done starting at age 35. ?Mammogram. This may be done every 1-2 years. Talk to your health care provider about how often you should have regular mammograms. ?Talk with your health care provider about your test results, treatment options, and if necessary, the need for more tests. ?Vaccines  ?Your health care provider may recommend certain vaccines, such as: ?Influenza vaccine. This is recommended every year. ?Tetanus, diphtheria, and acellular pertussis (Tdap, Td) vaccine. You may need a Td booster every 10 years. ?Zoster vaccine. You may need this after age 31. ?Pneumococcal 13-valent conjugate (PCV13) vaccine. One dose is recommended after age 64. ?Pneumococcal polysaccharide (PPSV23) vaccine. One dose is recommended after age 69. ?Talk to your health care  provider about which  screenings and vaccines you need and how often you need them. ?This information is not intended to replace advice given to you by your health care provider. Make sure you discuss any questions you have with your health care provider. ?Document Released: 01/24/2015 Document Revised: 09/17/2015 Document Reviewed: 10/29/2014 ?Elsevier Interactive Patient Education ? 2017 [employer]. ? ?Fall Prevention in the Home ?Falls can cause injuries. They can happen to people of all ages. There are many things you can do to make your home safe and to help prevent falls. ?What can I do on the outside of my home? ?Regularly fix the edges of walkways and driveways and fix any cracks. ?Remove anything that might make you trip as you walk through a door, such as a raised step or threshold. ?Trim any bushes or trees on the path to your home. ?Use bright outdoor lighting. ?Clear any walking paths of anything that might make someone trip, such as rocks or tools. ?Regularly check to see if handrails are loose or broken. Make sure that both sides of any steps have handrails. ?Any raised decks and porches should have guardrails on the edges. ?Have any leaves, snow, or ice cleared regularly. ?Use sand or salt on walking paths during winter. ?Clean up any spills in your garage right away. This includes oil or grease spills. ?What can I do in the bathroom? ?Use night lights. ?Install grab bars by the toilet and in the tub and shower. Do not use towel bars as grab bars. ?Use non-skid mats or decals in the tub or shower. ?If you need to sit down in the shower, use a plastic, non-slip stool. ?Keep the floor dry. Clean up any water that spills on the floor as soon as it happens. ?Remove soap buildup in the tub or shower regularly. ?Attach bath mats securely with double-sided non-slip rug tape. ?Do not have throw rugs and other things on the floor that can make you trip. ?What can I do in the bedroom? ?Use night lights. ?Make sure that you have a  light by your bed that is easy to reach. ?Do not use any sheets or blankets that are too big for your bed. They should not hang down onto the floor. ?Have a firm chair that has side arms. You can use this for support while you get dressed. ?Do not have throw rugs and other things on the floor that can make you trip. ?What can I do in the kitchen? ?Clean up any spills right away. ?Avoid walking on wet floors. ?Keep items that you use a lot in easy-to-reach places. ?If you need to reach something above you, use a strong step stool that has a grab bar. ?Keep electrical cords out of the way. ?Do not use floor polish or wax that makes floors slippery. If you must use wax, use non-skid floor wax. ?Do not have throw rugs and other things on the floor that can make you trip. ?What can I do with my stairs? ?Do not leave any items on the stairs. ?Make sure that there are handrails on both sides of the stairs and use them. Fix handrails that are broken or loose. Make sure that handrails are as long as the stairways. ?Check any carpeting to make sure that it is firmly attached to the stairs. Fix any carpet that is loose or worn. ?Avoid having throw rugs at the top or bottom of the stairs. If you do have throw rugs, attach them to the  floor with carpet tape. ?Make sure that you have a light switch at the top of the stairs and the bottom of the stairs. If you do not have them, ask someone to add them for you. ?What else can I do to help prevent falls? ?Wear shoes that: ?Do not have high heels. ?Have rubber bottoms. ?Are comfortable and fit you well. ?Are closed at the toe. Do not wear sandals. ?If you use a stepladder: ?Make sure that it is fully opened. Do not climb a closed stepladder. ?Make sure that both sides of the stepladder are locked into place. ?Ask someone to hold it for you, if possible. ?Clearly mark and make sure that you can see: ?Any grab bars or handrails. ?First and last steps. ?Where the edge of each step  is. ?Use tools that help you move around (mobility aids) if they are needed. These include: ?Canes. ?Walkers. ?Scooters. ?Crutches. ?Turn on the lights when you go into a dark area. Replace any light bulbs as soon

## 2021-06-11 ENCOUNTER — Other Ambulatory Visit: Payer: Self-pay | Admitting: Physician Assistant

## 2021-06-11 DIAGNOSIS — Z1231 Encounter for screening mammogram for malignant neoplasm of breast: Secondary | ICD-10-CM

## 2021-07-09 ENCOUNTER — Other Ambulatory Visit: Payer: Self-pay | Admitting: Physician Assistant

## 2021-07-09 ENCOUNTER — Encounter: Payer: Self-pay | Admitting: Physician Assistant

## 2021-07-09 ENCOUNTER — Other Ambulatory Visit: Payer: Self-pay | Admitting: Family Medicine

## 2021-07-09 ENCOUNTER — Ambulatory Visit (INDEPENDENT_AMBULATORY_CARE_PROVIDER_SITE_OTHER): Payer: PPO | Admitting: Physician Assistant

## 2021-07-09 VITALS — BP 135/72 | HR 82 | Ht 62.0 in | Wt 280.9 lb

## 2021-07-09 DIAGNOSIS — M1711 Unilateral primary osteoarthritis, right knee: Secondary | ICD-10-CM

## 2021-07-09 DIAGNOSIS — Z13820 Encounter for screening for osteoporosis: Secondary | ICD-10-CM

## 2021-07-09 DIAGNOSIS — E782 Mixed hyperlipidemia: Secondary | ICD-10-CM

## 2021-07-09 DIAGNOSIS — R739 Hyperglycemia, unspecified: Secondary | ICD-10-CM | POA: Diagnosis not present

## 2021-07-09 DIAGNOSIS — I480 Paroxysmal atrial fibrillation: Secondary | ICD-10-CM | POA: Diagnosis not present

## 2021-07-09 DIAGNOSIS — I10 Essential (primary) hypertension: Secondary | ICD-10-CM | POA: Diagnosis not present

## 2021-07-09 DIAGNOSIS — E559 Vitamin D deficiency, unspecified: Secondary | ICD-10-CM

## 2021-07-09 DIAGNOSIS — Z0000 Encounter for general adult medical examination without abnormal findings: Secondary | ICD-10-CM

## 2021-07-09 NOTE — Telephone Encounter (Signed)
Requested medication (s) are due for refill today: yes  Requested medication (s) are on the active medication list: yes  Last refill:  03/17/21 #90/0  Future visit scheduled: yes  Notes to clinic:  Unable to refill per protocol due to failed labs, no updated results.      Requested Prescriptions  Pending Prescriptions Disp Refills   meloxicam (MOBIC) 15 MG tablet [Pharmacy Med Name: MELOXICAM 15 MG TABLET] 90 tablet 0    Sig: TAKE 1 TABLET (15 MG TOTAL) BY MOUTH DAILY.     Analgesics:  COX2 Inhibitors Failed - 07/09/2021  5:08 PM      Failed - Manual Review: Labs are only required if the patient has taken medication for more than 8 weeks.      Failed - HGB in normal range and within 360 days    Hemoglobin  Date Value Ref Range Status  07/07/2020 12.6 11.1 - 15.9 g/dL Final         Failed - HCT in normal range and within 360 days    Hematocrit  Date Value Ref Range Status  07/07/2020 38.2 34.0 - 46.6 % Final         Passed - Cr in normal range and within 360 days    Creatinine, Ser  Date Value Ref Range Status  02/13/2021 0.90 0.57 - 1.00 mg/dL Final         Passed - AST in normal range and within 360 days    AST  Date Value Ref Range Status  02/13/2021 23 0 - 40 IU/L Final         Passed - ALT in normal range and within 360 days    ALT  Date Value Ref Range Status  02/13/2021 14 0 - 32 IU/L Final         Passed - eGFR is 30 or above and within 360 days    GFR calc Af Amer  Date Value Ref Range Status  07/12/2019 88 >59 mL/min/1.73 Final    Comment:    **[facility] currently reports eGFR in compliance with the current**   recommendations of the [employer]. [facility] will   update reporting as new guidelines are published from the NKF-ASN   Task force.    GFR calc non Af Amer  Date Value Ref Range Status  07/12/2019 77 >59 mL/min/1.73 Final   eGFR  Date Value Ref Range Status  02/13/2021 70 >59 mL/min/1.73 Final         Passed - Patient is  not pregnant      Passed - Valid encounter within last 12 months    Recent Outpatient Visits           Today Annual physical exam   [facility] [name], PA-C   4 months ago Hypertension, unspecified type   [facility] [place] [name], PA-C   1 year ago Annual physical exam   [facility], Vickki Muff, PA-C   2 years ago Annual physical exam   [facility], Vickki Muff, PA-C   2 years ago [place], Vickki Muff, PA-C       Future Appointments             In 6 months Drubel, Ria Comment, PA-C [employer], [place]

## 2021-07-09 NOTE — Progress Notes (Signed)
I,Sha'taria Tyson,acting as a Education administrator for [employer], PA-C.,have documented all relevant documentation on the behalf of [name], PA-C,as directed by  [name], PA-C while in the presence of [name], PA-C.   Complete physical exam   Patient: Karen [name]   DOB: 04/15/1950   71 y.o. Female  MRN: [id number] Visit Date: 07/09/2021  Today's healthcare provider: [name], PA-C   Cc. cpe  Subjective    Karen [name] is a 71 y.o. female who presents today for a complete physical exam.  She reports consuming a general diet.  The patient reports trying to walk at least 3xs a week for 15 minutes.   She generally feels well. She reports sleeping well. She does not have additional problems to discuss today.  HPI  Past Medical History:  Diagnosis Date   A-fib ([place])    Anemia    Hyperlipidemia    Vitamin D deficiency    Past Surgical History:  Procedure Laterality Date   ABDOMINAL HYSTERECTOMY     CHOLECYSTECTOMY     CHOLECYSTECTOMY  1987   COLONOSCOPY WITH PROPOFOL N/A 10/17/2019   Procedure: COLONOSCOPY WITH PROPOFOL;  Surgeon: Robert Bellow, MD;  Location: [facility] ENDOSCOPY;  Service: Endoscopy;  Laterality: N/A;   GASTROPLASTY  1987   HERNIA REPAIR     Social History   Socioeconomic History   Marital status: Married    Spouse name: Not on file   Number of children: 0   Years of education: Not on file   Highest education level: High school graduate  Occupational History   Occupation: retired  Tobacco Use   Smoking status: Never   Smokeless tobacco: Never  Vaping Use   Vaping Use: Never used  Substance and Sexual Activity   Alcohol use: Yes    Comment: rarely Liqueur   Drug use: No   Sexual activity: Yes    Birth control/protection: Post-menopausal  Other Topics Concern   Not on file  Social History Narrative   Not on file   Social Determinants of Health   Financial Resource Strain: Low Risk  (05/05/2021)   Overall Financial  Resource Strain (CARDIA)    Difficulty of Paying Living Expenses: Not very hard  Food Insecurity: No Food Insecurity (05/05/2021)   Hunger Vital Sign    Worried About Running Out of Food in the Last Year: Never true    Ran Out of Food in the Last Year: Never true  Transportation Needs: No Transportation Needs (05/05/2021)   PRAPARE - Hydrologist (Medical): No    Lack of Transportation (Non-Medical): No  Physical Activity: Insufficiently Active (05/05/2021)   Exercise Vital Sign    Days of Exercise per Week: 1 day    Minutes of Exercise per Session: 20 min  Stress: No Stress Concern Present (05/05/2021)   [place]    Feeling of Stress : Not at all  Social Connections: Moderately Integrated (05/05/2021)   Social Connection and Isolation Panel [NHANES]    Frequency of Communication with Friends and Family: More than three times a week    Frequency of Social Gatherings with Friends and Family: Twice a week    Attends Religious Services: Never    Marine scientist or Organizations: Yes    Attends Archivist Meetings: 1 to 4 times per year    Marital Status: Married  Human resources officer Violence: Not At Risk (05/05/2021)  Humiliation, Afraid, Rape, and Kick questionnaire    Fear of Current or Ex-Partner: No    Emotionally Abused: No    Physically Abused: No    Sexually Abused: No   Family Status  Relation Name Status   Mother  Deceased   Father  Deceased   Sister  Deceased   Brother  Deceased   PGF  Deceased   Brother  Alive   MGM  (Not Specified)   Mat Aunt  (Not Specified)   Family History  Problem Relation Age of Onset   Hypertension Mother    Cancer Mother    Arthritis Mother    Depression Mother    Heart disease Father    Breast cancer Sister    Pancreatic cancer Brother    Cancer Paternal Grandfather    Hyperlipidemia Brother    Hypertension Brother    Heart  disease Maternal Grandmother    Stroke Maternal Grandmother    Breast cancer Maternal Aunt    Allergies  Allergen Reactions   Tetanus Toxoid     Multiple joint pains.    Patient Care Team: [name], PA-C as PCP - General (Physician Assistant) Arelia Sneddon, Elizabeth (Optometry) Ubaldo Glassing Javier Docker, MD as Consulting Physician (Cardiology) Bary Castilla Forest Gleason, MD as Consulting Physician (General Surgery) Conception Chancy, DDS as Referring Physician (Dentistry)   Medications: Outpatient Medications Prior to Visit  Medication Sig   apixaban (ELIQUIS) 5 MG TABS tablet Take 5 mg by mouth 2 (two) times daily.   aspirin-acetaminophen-caffeine (EXCEDRIN MIGRAINE) 250-250-65 MG tablet Take by mouth as needed for headache.   atorvastatin (LIPITOR) 40 MG tablet TAKE 1 TABLET BY MOUTH EVERY DAY   b complex vitamins capsule Take 1 capsule by mouth daily.    Calcium Carbonate (CALCIUM 600 PO) Take by mouth daily.   cholecalciferol (VITAMIN D) 1000 UNITS tablet Take 1,000 Units by mouth daily.    diltiazem (CARDIZEM CD) 120 MG 24 hr capsule Take by mouth.   meloxicam (MOBIC) 15 MG tablet TAKE 1 TABLET (15 MG TOTAL) BY MOUTH DAILY.   MULTIPLE VITAMIN PO Take by mouth daily.   Omega-3 Fatty Acids (FISH OIL) 1000 MG CAPS Take by mouth daily.    SUMAtriptan (IMITREX) 50 MG tablet Take 1 tablet by mouth as needed.   [DISCONTINUED] aspirin 81 MG EC tablet Take 81 mg by mouth every other day. Swallow whole. (Patient not taking: No sig reported)   [DISCONTINUED] diltiazem (TIAZAC) 120 MG 24 hr capsule Take 120 mg by mouth daily.   [DISCONTINUED] timolol (TIMOPTIC) 0.25 % ophthalmic solution Place 1 drop into both eyes daily.  (Patient not taking: Reported on 04/10/2020)   No facility-administered medications prior to visit.    Review of Systems  HENT:  Positive for ear pain.   Musculoskeletal:  Positive for arthralgias and back pain.  Neurological:  Positive for headaches.      Objective     Blood  pressure 135/72, pulse 82, height '5\' 2"'$  (1.575 m), weight 280 lb 14.4 oz (127.4 kg), SpO2 100 %.    Physical Exam Constitutional:      General: She is awake.     Appearance: She is well-developed. She is not ill-appearing.  HENT:     Head: Normocephalic.     Right Ear: Tympanic membrane normal.     Left Ear: Tympanic membrane normal.     Nose: Nose normal. No congestion or rhinorrhea.     Mouth/Throat:     Pharynx: No oropharyngeal  exudate or posterior oropharyngeal erythema.  Eyes:     Conjunctiva/sclera: Conjunctivae normal.     Pupils: Pupils are equal, round, and reactive to light.  Neck:     Thyroid: No thyroid mass or thyromegaly.  Cardiovascular:     Rate and Rhythm: Normal rate and regular rhythm.     Heart sounds: Normal heart sounds.  Pulmonary:     Effort: Pulmonary effort is normal.     Breath sounds: Normal breath sounds.  Abdominal:     Palpations: Abdomen is soft.     Tenderness: There is no abdominal tenderness.  Musculoskeletal:     Right lower leg: No swelling. No edema.     Left lower leg: No swelling. No edema.  Lymphadenopathy:     Cervical: No cervical adenopathy.  Skin:    General: Skin is warm.  Neurological:     Mental Status: She is alert and oriented to person, place, and time.  Psychiatric:        Attention and Perception: Attention normal.        Mood and Affect: Mood normal.        Speech: Speech normal.        Behavior: Behavior normal. Behavior is cooperative.     Last depression screening scores    07/09/2021    9:56 AM 05/05/2021   10:29 AM 02/12/2021    3:57 PM  PHQ 2/9 Scores  PHQ - 2 Score 0 0 0  PHQ- 9 Score 0  1   Last fall risk screening    07/09/2021    9:55 AM  Fall Risk   Falls in the past year? 0  Number falls in past yr: 0  Injury with Fall? 0  Risk for fall due to : No Fall Risks   Last Audit-C alcohol use screening    07/09/2021    9:55 AM  Alcohol Use Disorder Test (AUDIT)  1. How often do you have a drink  containing alcohol? 1  2. How many drinks containing alcohol do you have on a typical day when you are drinking? 0  3. How often do you have six or more drinks on one occasion? 0  AUDIT-C Score 1   A score of 3 or more in women, and 4 or more in men indicates increased risk for alcohol abuse, EXCEPT if all of the points are from question 1   No results found for any visits on 07/09/21.  Assessment & Plan    Routine Health Maintenance and Physical Exam  Exercise Activities and Dietary recommendations  Goals      DIET - EAT MORE FRUITS AND VEGETABLES     Exercise 3x per week (30 min per time)     Recommend to exercise for 3 days a week for at least 30 minutes at a time.         Immunization History  Administered Date(s) Administered   Fluad Quad(high Dose 65+) 09/28/2018, 12/26/2019, 11/05/2020   Influenza Split 11/08/2008, 02/23/2011, 11/23/2011   Influenza, High Dose Seasonal PF 11/03/2017   Influenza,inj,Quad PF,6+ Mos 11/08/2013, 10/24/2014, 10/15/2016   Influenza-Unspecified 11/08/2013   Moderna Sars-Covid-2 Vaccination 03/24/2019, 04/21/2019   PFIZER(Purple Top)SARS-COV-2 Vaccination 12/26/2019   Pneumococcal Conjugate-13 05/10/2017, 11/03/2017   Pneumococcal Polysaccharide-23 07/06/2019   Zoster, Live 03/20/2015    Health Maintenance  Topic Date Due   DEXA SCAN  04/15/2020   COVID-19 Vaccine (4 - Booster) 07/25/2021 (Originally 02/20/2020)   Zoster Vaccines- Shingrix (1 of 2) 10/09/2021 (  Originally 06/20/1971)   MAMMOGRAM  07/25/2021   INFLUENZA VACCINE  08/11/2021   Fecal DNA (Cologuard)  08/07/2022   TETANUS/TDAP  03/19/2025   Pneumonia Vaccine 33+ Years old  Completed   Hepatitis C Screening  Completed   HPV VACCINES  Aged Out   COLONOSCOPY (Pts 45-7yr Insurance coverage will need to be confirmed)  Discontinued    Discussed health benefits of physical activity, and encouraged her to engage in regular exercise appropriate for her age and condition.  Problem  List Items Addressed This Visit       Cardiovascular and Mediastinum   Paroxysmal atrial fibrillation (HCC)    Normal rate and rhythm today. Was normal sinus at last cardio visit.        Relevant Medications   diltiazem (CARDIZEM CD) 120 MG 24 hr capsule   Other Relevant Orders   Comprehensive metabolic panel   CBC w/Diff/Platelet   Hypertension    Elevated in office, home values and bp at cardio visit wnl. Ordered cmp        Relevant Medications   diltiazem (CARDIZEM CD) 120 MG 24 hr capsule   Other Relevant Orders   Comprehensive metabolic panel     Other   HLD (hyperlipidemia)    Managing with atorvastatin 40 mg. Will check fasting lipids      Relevant Medications   diltiazem (CARDIZEM CD) 120 MG 24 hr capsule   Other Relevant Orders   Comprehensive metabolic panel   Lipid panel   Screening for osteoporosis    Last dexa 2017 osteopenia      Relevant Orders   DG Bone Density   Avitaminosis D    Historically, takes 1,000 U vit d daily. Will recheck       Relevant Orders   Vitamin D (25 hydroxy)   Other Visit Diagnoses     Annual physical exam    -  Primary   Hyperglycemia       Relevant Orders   HgB A1c        Return in about 6 months (around 01/08/2022) for chronic conditions.     I, LMikey Kirschner PA-C have reviewed all documentation for this visit. The documentation on  07/09/2021 for the exam, diagnosis, procedures, and orders are all accurate and complete.  LMikey Kirschner PA-C [facility] [place] [address]Office: [phone]: [phone]

## 2021-07-09 NOTE — Assessment & Plan Note (Signed)
Normal rate and rhythm today. Was normal sinus at last cardio visit.

## 2021-07-09 NOTE — Assessment & Plan Note (Signed)
Managing with atorvastatin 40 mg. Will check fasting lipids

## 2021-07-09 NOTE — Assessment & Plan Note (Signed)
Last dexa 2017 osteopenia

## 2021-07-09 NOTE — Assessment & Plan Note (Signed)
Historically, takes 1,000 U vit d daily. Will recheck

## 2021-07-09 NOTE — Assessment & Plan Note (Signed)
Elevated in office, home values and bp at cardio visit wnl. Ordered cmp

## 2021-07-10 LAB — COMPREHENSIVE METABOLIC PANEL
ALT: 11 IU/L (ref 0–32)
AST: 24 IU/L (ref 0–40)
Albumin/Globulin Ratio: 1.8 (ref 1.2–2.2)
Albumin: 4.3 g/dL (ref 3.8–4.8)
Alkaline Phosphatase: 73 IU/L (ref 44–121)
BUN/Creatinine Ratio: 18 (ref 12–28)
BUN: 15 mg/dL (ref 8–27)
Bilirubin Total: 0.9 mg/dL (ref 0.0–1.2)
CO2: 24 mmol/L (ref 20–29)
Calcium: 10.3 mg/dL (ref 8.7–10.3)
Chloride: 106 mmol/L (ref 96–106)
Creatinine, Ser: 0.82 mg/dL (ref 0.57–1.00)
Globulin, Total: 2.4 g/dL (ref 1.5–4.5)
Glucose: 107 mg/dL — ABNORMAL HIGH (ref 70–99)
Potassium: 4.1 mmol/L (ref 3.5–5.2)
Sodium: 143 mmol/L (ref 134–144)
Total Protein: 6.7 g/dL (ref 6.0–8.5)
eGFR: 77 mL/min/{1.73_m2} (ref >59)

## 2021-07-10 LAB — CBC WITH DIFFERENTIAL/PLATELET
Basophils Absolute: 0 10*3/uL (ref 0.0–0.2)
Basos: 1 %
EOS (ABSOLUTE): 0.2 10*3/uL (ref 0.0–0.4)
Eos: 3 %
Hematocrit: 37 % (ref 34.0–46.6)
Hemoglobin: 12 g/dL (ref 11.1–15.9)
Immature Grans (Abs): 0 10*3/uL (ref 0.0–0.1)
Immature Granulocytes: 0 %
Lymphocytes Absolute: 1.6 10*3/uL (ref 0.7–3.1)
Lymphs: 30 %
MCH: 32.7 pg (ref 26.6–33.0)
MCHC: 32.4 g/dL (ref 31.5–35.7)
MCV: 101 fL — ABNORMAL HIGH (ref 79–97)
Monocytes Absolute: 0.5 10*3/uL (ref 0.1–0.9)
Monocytes: 10 %
Neutrophils Absolute: 3 10*3/uL (ref 1.4–7.0)
Neutrophils: 56 %
Platelets: 202 10*3/uL (ref 150–450)
RBC: 3.67 x10E6/uL — ABNORMAL LOW (ref 3.77–5.28)
RDW: 11.8 % (ref 11.7–15.4)
WBC: 5.3 10*3/uL (ref 3.4–10.8)

## 2021-07-10 LAB — LIPID PANEL
Chol/HDL Ratio: 2.6 ratio (ref 0.0–4.4)
Cholesterol, Total: 145 mg/dL (ref 100–199)
HDL: 56 mg/dL (ref >39)
LDL Chol Calc (NIH): 71 mg/dL (ref 0–99)
Triglycerides: 95 mg/dL (ref 0–149)
VLDL Cholesterol Cal: 18 mg/dL (ref 5–40)

## 2021-07-10 LAB — VITAMIN D 25 HYDROXY (VIT D DEFICIENCY, FRACTURES): Vit D, 25-Hydroxy: 31.5 ng/mL (ref 30.0–100.0)

## 2021-07-10 LAB — HEMOGLOBIN A1C
Est. average glucose Bld gHb Est-mCnc: 97 mg/dL
Hgb A1c MFr Bld: 5 % (ref 4.8–5.6)

## 2021-07-27 ENCOUNTER — Ambulatory Visit
Admission: RE | Admit: 2021-07-27 | Discharge: 2021-07-27 | Disposition: A | Discharge status: Home / Self Care | Payer: PPO | Source: Ambulatory Visit | Attending: Physician Assistant | Admitting: Physician Assistant

## 2021-07-27 DIAGNOSIS — Z1231 Encounter for screening mammogram for malignant neoplasm of breast: Secondary | ICD-10-CM | POA: Diagnosis present

## 2021-09-17 ENCOUNTER — Ambulatory Visit
Admission: RE | Admit: 2021-09-17 | Discharge: 2021-09-17 | Disposition: A | Discharge status: Home / Self Care | Payer: PPO | Source: Ambulatory Visit | Attending: Physician Assistant | Admitting: Physician Assistant

## 2021-09-17 DIAGNOSIS — Z13820 Encounter for screening for osteoporosis: Secondary | ICD-10-CM | POA: Diagnosis present

## 2021-09-17 DIAGNOSIS — Z780 Asymptomatic menopausal state: Secondary | ICD-10-CM | POA: Diagnosis not present

## 2021-09-17 DIAGNOSIS — M85851 Other specified disorders of bone density and structure, right thigh: Secondary | ICD-10-CM | POA: Insufficient documentation

## 2021-09-23 IMAGING — MG MM DIGITAL SCREENING BILAT W/ TOMO AND CAD
8 of 16 series · 8 of 40 positions shown · non-contrast
Comparison: Previous exam(s).

ACR Breast Density Category a: The breast tissue is almost entirely
fatty.

CLINICAL DATA: Screening.

EXAM:
DIGITAL SCREENING BILATERAL MAMMOGRAM WITH TOMOSYNTHESIS AND CAD
TECHNIQUE: Bilateral screening digital craniocaudal and mediolateral oblique
mammograms were obtained. Bilateral screening digital breast
tomosynthesis was performed. The images were evaluated with
computer-aided detection.

[L CC synth-2D (1 of 2)]
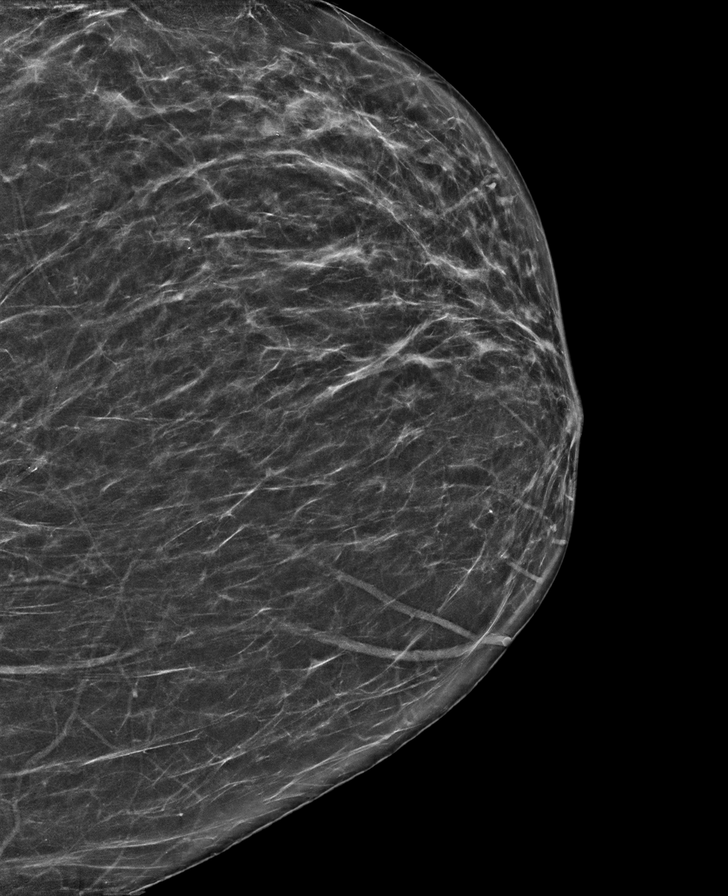

[L MLO synth-2D (1 of 2)]
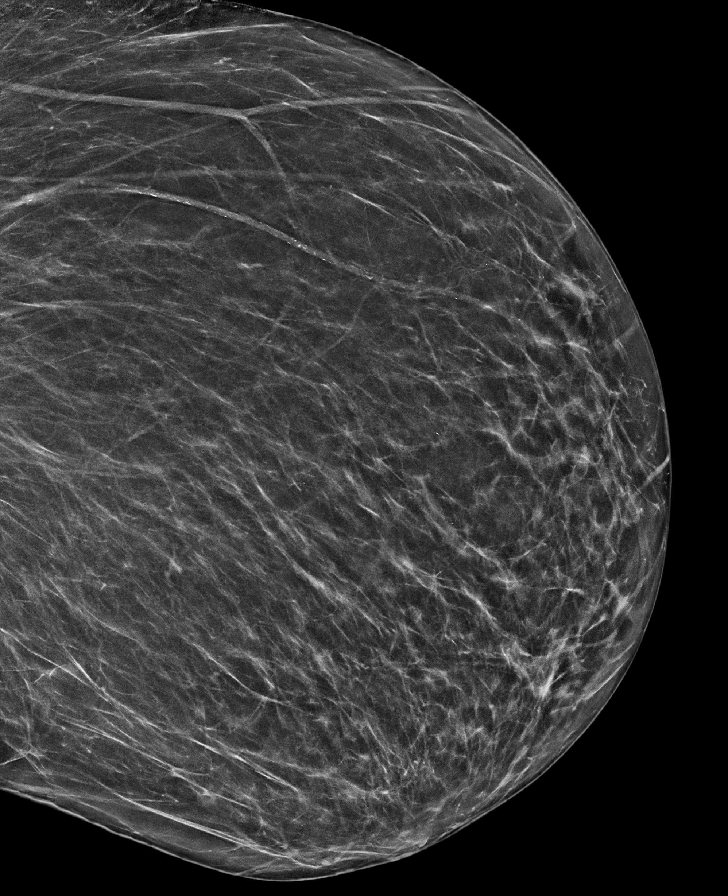

[R MLO synth-2D (1 of 2)]
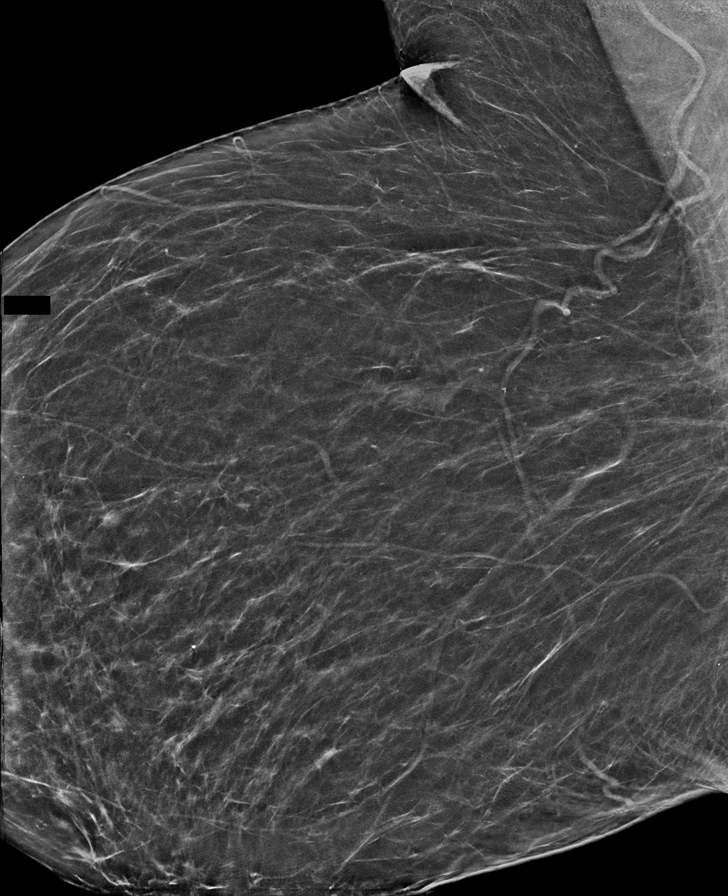

[R CC synth-2D]
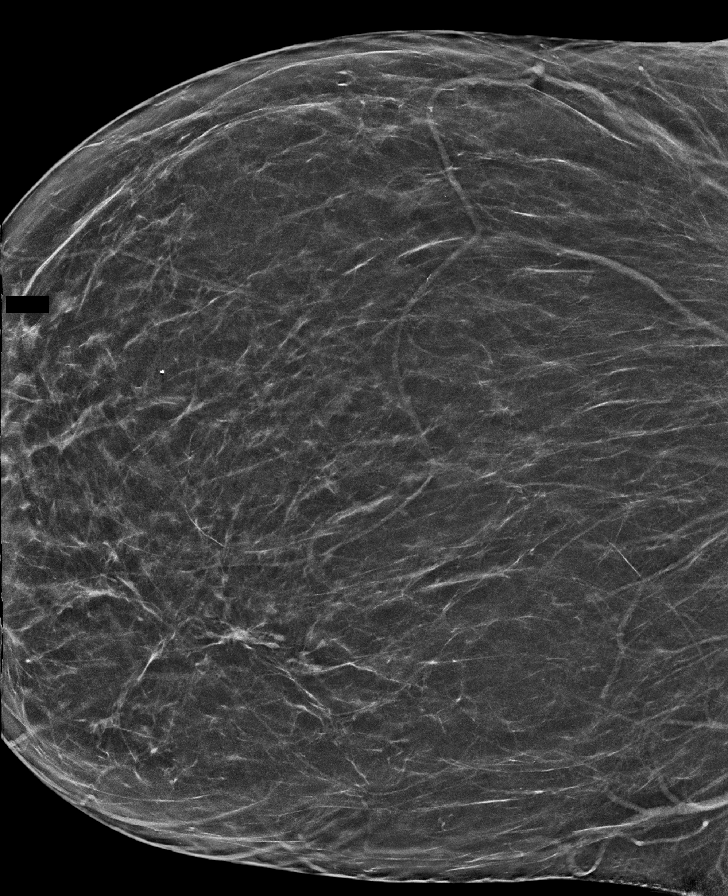

[L MLO synth-2D (2 of 2)]
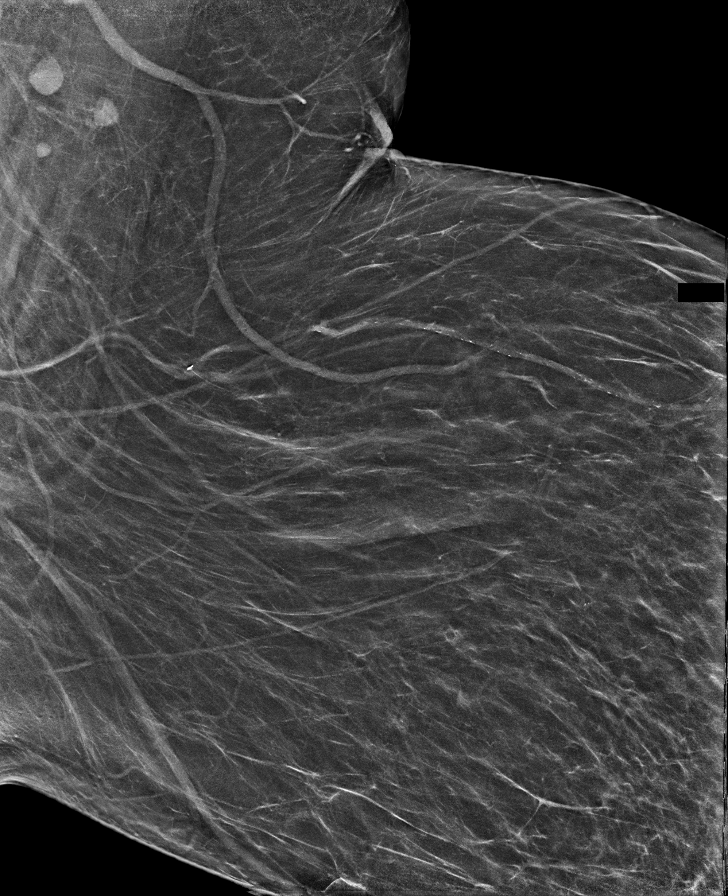

[L CC synth-2D (2 of 2)]
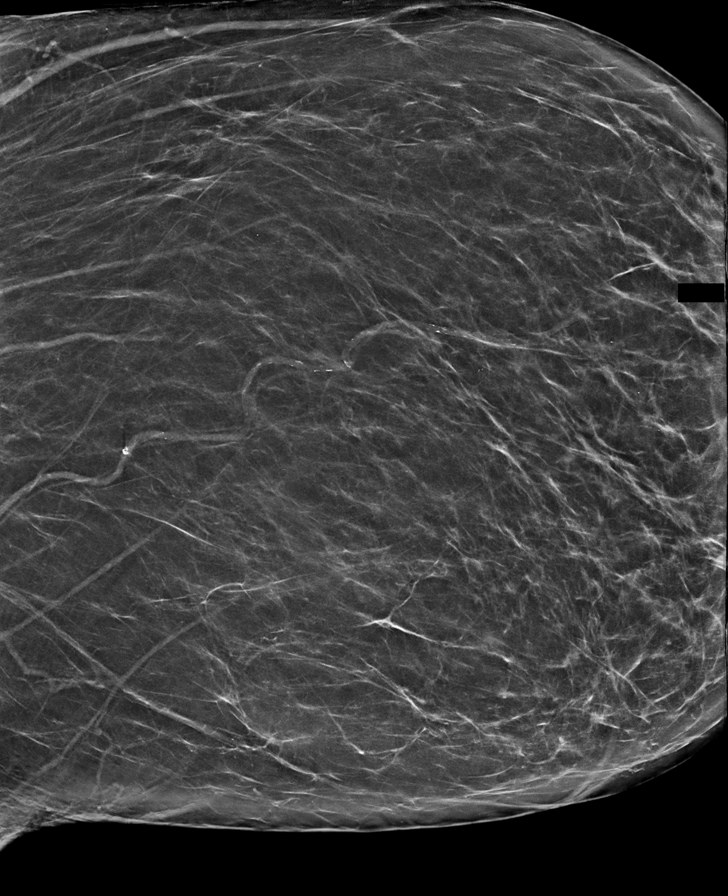

[R MLO synth-2D (2 of 2)]
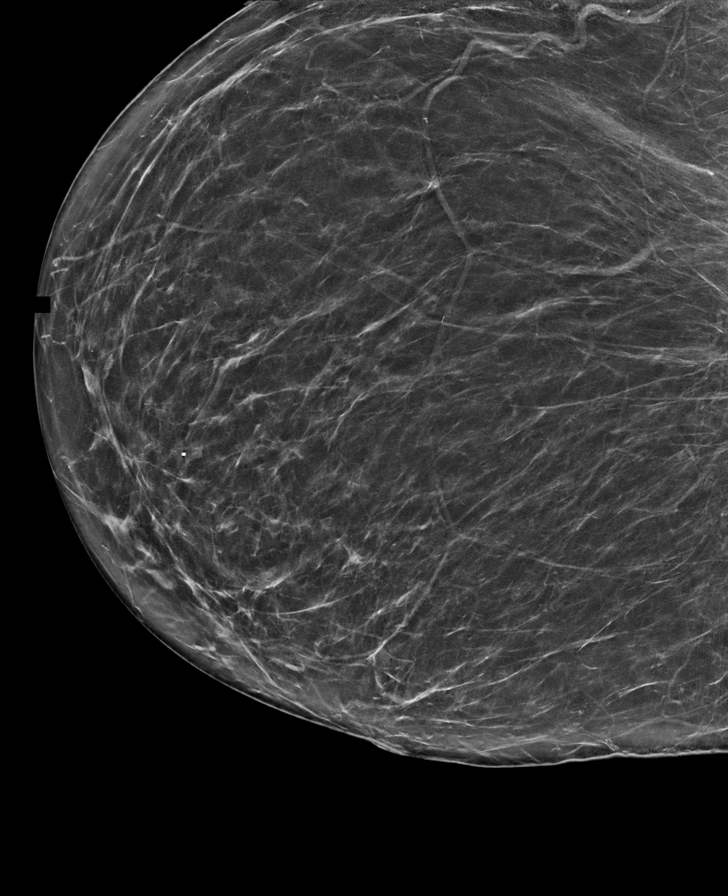

[L MLO tomo · tomo slice 39/77.0]
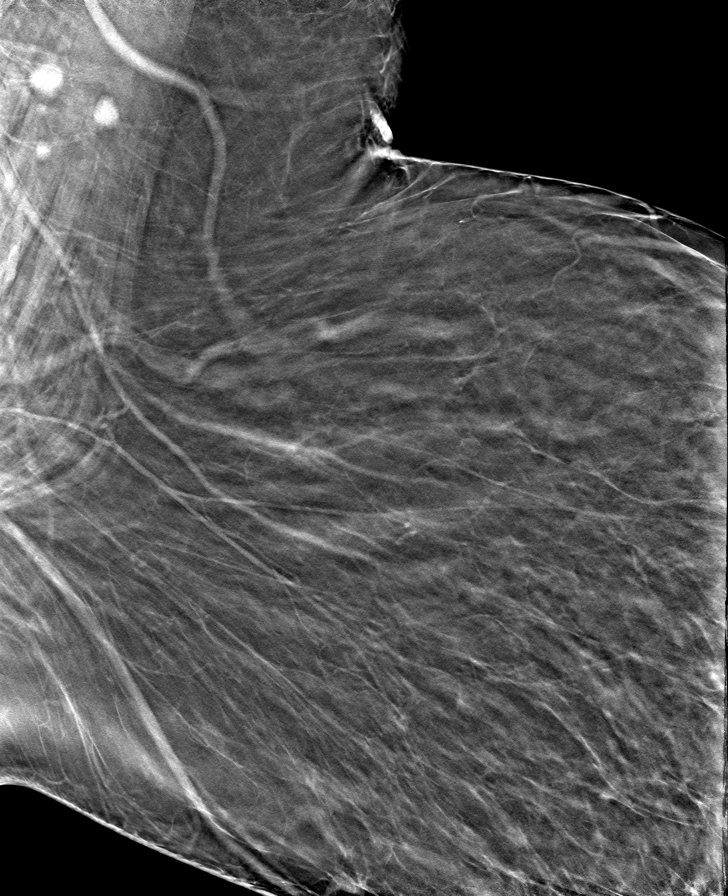

[8 of 40 positions shown; findings below may reference images not displayed]

FINDINGS: There are no findings suspicious for malignancy.
IMPRESSION: No mammographic evidence of malignancy. A result letter of this
screening mammogram will be mailed directly to the patient.

RECOMMENDATION:
Screening mammogram in one year. (Code:0E-3-N98)

BI-RADS CATEGORY  1: Negative.

## 2021-11-02 ENCOUNTER — Other Ambulatory Visit: Payer: Self-pay | Admitting: Physician Assistant

## 2021-11-02 DIAGNOSIS — M1711 Unilateral primary osteoarthritis, right knee: Secondary | ICD-10-CM

## 2021-11-24 ENCOUNTER — Ambulatory Visit (INDEPENDENT_AMBULATORY_CARE_PROVIDER_SITE_OTHER): Payer: PPO | Admitting: Physician Assistant

## 2021-11-24 ENCOUNTER — Encounter: Payer: Self-pay | Admitting: Physician Assistant

## 2021-11-24 VITALS — BP 109/80 | HR 90 | Temp 98.0°F | Ht 62.0 in | Wt 277.6 lb

## 2021-11-24 DIAGNOSIS — J069 Acute upper respiratory infection, unspecified: Secondary | ICD-10-CM

## 2021-11-24 LAB — POCT INFLUENZA A/B
Influenza A, POC: NEGATIVE
Influenza B, POC: NEGATIVE

## 2021-11-24 LAB — POC COVID19 BINAXNOW: SARS Coronavirus 2 Ag: NEGATIVE

## 2021-11-24 NOTE — Progress Notes (Signed)
I,Sha'taria Tyson,acting as a Education administrator for [employer], PA-C.,have documented all relevant documentation on the behalf of Karen Kirschner, PA-C,as directed by  Karen Kirschner, PA-C while in the presence of Karen Kirschner, PA-C.   Established patient visit   Patient: Karen [name]   DOB: 22-Dec-1950   71 y.o. Female  MRN: [id number] Visit Date: 11/24/2021  Today's healthcare provider: [name], PA-C  Cc. Congestion, cough x 4 days  Subjective    HPI  Pt reports nasal congestion, productive cough with yellow mucous, runny nose, shortness of breath. Reports sore throat that improved today. Overall symptoms x 4 days. Reports taking OTC alka seltzer at night. Denies fevers, chills, sick contacts.    Medications: Outpatient Medications Prior to Visit  Medication Sig   apixaban (ELIQUIS) 5 MG TABS tablet Take 5 mg by mouth 2 (two) times daily.   aspirin-acetaminophen-caffeine (EXCEDRIN MIGRAINE) 250-250-65 MG tablet Take by mouth as needed for headache.   atorvastatin (LIPITOR) 40 MG tablet TAKE 1 TABLET BY MOUTH EVERY DAY   b complex vitamins capsule Take 1 capsule by mouth daily.    Calcium Carbonate (CALCIUM 600 PO) Take by mouth daily.   cholecalciferol (VITAMIN D) 1000 UNITS tablet Take 1,000 Units by mouth daily.    diltiazem (CARDIZEM CD) 120 MG 24 hr capsule Take by mouth.   meloxicam (MOBIC) 15 MG tablet TAKE 1 TABLET (15 MG TOTAL) BY MOUTH DAILY.   MULTIPLE VITAMIN PO Take by mouth daily.   Omega-3 Fatty Acids (FISH OIL) 1000 MG CAPS Take by mouth daily.    SUMAtriptan (IMITREX) 50 MG tablet Take 1 tablet by mouth as needed.   No facility-administered medications prior to visit.    Review of Systems  Constitutional:  Negative for fatigue and fever.  HENT:  Positive for congestion, postnasal drip and rhinorrhea.   Respiratory:  Positive for cough and shortness of breath.   Cardiovascular:  Negative for chest pain and leg swelling.  Gastrointestinal:  Negative for  abdominal pain.  Neurological:  Negative for dizziness and headaches.      Objective    Blood pressure 109/80, pulse 90, temperature 98 F (36.7 C), temperature source Oral, height '5\' 2"'$  (1.575 m), weight 277 lb 9.6 oz (125.9 kg), SpO2 100 %.   Physical Exam Constitutional:      General: She is awake.     Appearance: She is well-developed.  HENT:     Head: Normocephalic.     Right Ear: Tympanic membrane normal.     Left Ear: Tympanic membrane normal.     Nose: Congestion present.     Mouth/Throat:     Pharynx: Posterior oropharyngeal erythema present.  Eyes:     Conjunctiva/sclera: Conjunctivae normal.  Cardiovascular:     Rate and Rhythm: Normal rate.  Pulmonary:     Effort: Pulmonary effort is normal.     Breath sounds: Normal breath sounds. No wheezing, rhonchi or rales.  Skin:    General: Skin is warm.  Neurological:     Mental Status: She is alert and oriented to person, place, and time.  Psychiatric:        Attention and Perception: Attention normal.        Mood and Affect: Mood normal.        Speech: Speech normal.        Behavior: Behavior is cooperative.     Results for orders placed or performed in visit on 11/24/21  POC COVID-19  Result Value  Ref Range   SARS Coronavirus 2 Ag Negative Negative  POCT Influenza A/B  Result Value Ref Range   Influenza A, POC Negative Negative   Influenza B, POC Negative Negative    Assessment & Plan     URI Poc covid 19 neg, flu a/b neg Advised rest, OTC mucinex, robitussion, fluids, tylenol for pain  If symptoms do not improve in the next 3-4 days, if any fever, increased shortness of breath, chest pain, please call office.  Return if symptoms worsen or fail to improve.      I, Karen Kirschner, PA-C have reviewed all documentation for this visit. The documentation on  11/24/2021  for the exam, diagnosis, procedures, and orders are all accurate and complete.  Karen Kirschner, PA-C [facility] [address] [place], [place], [address] Office: [phone] Fax: [place]

## 2021-11-26 ENCOUNTER — Ambulatory Visit: Payer: Self-pay

## 2021-11-26 ENCOUNTER — Telehealth: Payer: Self-pay | Admitting: Physician Assistant

## 2021-11-26 ENCOUNTER — Other Ambulatory Visit: Payer: Self-pay | Admitting: Physician Assistant

## 2021-11-26 MED ORDER — AMOXICILLIN 875 MG PO TABS: 875.0000 mg | ORAL_TABLET | Freq: Two times a day (BID) | ORAL | 0 refills | Status: AC

## 2021-11-26 NOTE — Telephone Encounter (Signed)
Pt called, unable to leave VM d/t mailbox full.   Summary: pt not better   Pt states Mendel Ryder told her if she was not better by today she would call something in for her. Pt stays she is not better,, and hopes Ria Comment will call something can be called in.  She did get OTC stuff lindsey suggested, but it did not help. [id number] [place]  [place] [address]

## 2021-11-26 NOTE — Telephone Encounter (Signed)
Pt is calling to see if Ria Comment is able call in medication for her. Please advise CB- [phone]

## 2021-11-26 NOTE — Telephone Encounter (Signed)
Patient advised.

## 2021-11-26 NOTE — Telephone Encounter (Signed)
  Chief Complaint: URI  Symptoms:  Pt reports nasal congestion, productive cough with yellow mucous, runny nose, shortness of breath.     Frequency: 1 week now Pertinent Negatives: Patient denies sore throat currently but comes and goes  Disposition: '[]'$ ED /'[]'$ Urgent Care (no appt availability in office) / '[]'$ Appointment(In office/virtual)/ '[]'$  [place] [facility]/ '[]'$ Home Care/ '[]'$ Refused Recommended Disposition /'[]'$ [place] [facility]/ '[x]'$  Follow-up with PCP Additional Notes: pt had OV on 11/24/21 and was advised to buy OTC to help with sx. Pt did and not helped at all. Pt was advised to call back today if sx no improvement. Pt asking if something can be called in to help since she had OV already. Advised pt I would send message back to provider for review.   Reason for Disposition  [1] Sinus congestion (pressure, fullness) AND [2] present > 10 days  Answer Assessment - Initial Assessment Questions 1. ONSET: "When did the nasal discharge start?"      1 week  2. AMOUNT: "How much discharge is there?"      Mild to moderate  3. COUGH: "Do you have a cough?" If Yes, ask: "Describe the color of your sputum" (clear, white, yellow, green)     Yes, yellow mucus 4. RESPIRATORY DISTRESS: "Describe your breathing."      SOB at times  5. FEVER: "Do you have a fever?" If Yes, ask: "What is your temperature, how was it measured, and when did it start?"     no 6. SEVERITY: "Overall, how bad are you feeling right now?" (e.g., doesn't interfere with normal activities, staying home from school/work, staying in bed)      No improvement since OV on 11/24/21 7. OTHER SYMPTOMS: "Do you have any other symptoms?" (e.g., sore throat, earache, wheezing, vomiting) Pt reports nasal congestion, productive cough with yellow mucous, runny nose, shortness of breath.  Protocols used: Common Cold-A-AH

## 2021-12-02 ENCOUNTER — Other Ambulatory Visit: Payer: Self-pay | Admitting: Physician Assistant

## 2021-12-02 DIAGNOSIS — M1711 Unilateral primary osteoarthritis, right knee: Secondary | ICD-10-CM

## 2022-01-08 ENCOUNTER — Ambulatory Visit: Payer: PPO | Admitting: Physician Assistant

## 2022-01-12 NOTE — Progress Notes (Unsigned)
I,Sha'taria Tyson,acting as a Education administrator for [employer], PA-C.,have documented all relevant documentation on the behalf of Karen Kirschner, PA-C,as directed by  Karen Kirschner, PA-C while in the presence of Karen Kirschner, PA-C.   Established patient visit   Patient: Karen [name]   DOB: 27-Dec-1950   72 y.o. Female  MRN: [id number] Visit Date: 01/13/2022  Today's healthcare provider: [name], PA-C   Cc. HTN f/u  Subjective    HPI   Hypertension, follow-up  BP Readings from Last 3 Encounters:  01/13/22 115/79  11/24/21 109/80  07/09/21 135/72   Wt Readings from Last 3 Encounters:  01/13/22 279 lb 6.4 oz (126.7 kg)  11/24/21 277 lb 9.6 oz (125.9 kg)  07/09/21 280 lb 14.4 oz (127.4 kg)     She was last seen for hypertension 6 months ago.  BP at that visit was 135/72. Management since that visit includes continue current treatment.  Use of agents associated with hypertension: none.   Outside blood pressures are checked on occasions Symptoms: No chest pain No chest pressure  No palpitations No syncope  No dyspnea No orthopnea  No paroxysmal nocturnal dyspnea No lower extremity edema   Pertinent labs Lab Results  Component Value Date   CHOL 145 07/09/2021   HDL 56 07/09/2021   LDLCALC 71 07/09/2021   TRIG 95 07/09/2021   CHOLHDL 2.6 07/09/2021   Lab Results  Component Value Date   NA 143 07/09/2021   K 4.1 07/09/2021   CREATININE 0.82 07/09/2021   EGFR 77 07/09/2021   GLUCOSE 107 (H) 07/09/2021   TSH 1.500 07/07/2020    ------------------------------------------------------------------------------------------------ Medications: Outpatient Medications Prior to Visit  Medication Sig   apixaban (ELIQUIS) 5 MG TABS tablet Take 5 mg by mouth 2 (two) times daily.   aspirin-acetaminophen-caffeine (EXCEDRIN MIGRAINE) 250-250-65 MG tablet Take by mouth as needed for headache.   atorvastatin (LIPITOR) 40 MG tablet TAKE 1 TABLET BY MOUTH EVERY DAY   b complex  vitamins capsule Take 1 capsule by mouth daily.    Calcium Carbonate (CALCIUM 600 PO) Take by mouth daily.   cholecalciferol (VITAMIN D) 1000 UNITS tablet Take 1,000 Units by mouth daily.    diltiazem (CARDIZEM CD) 120 MG 24 hr capsule Take by mouth.   meloxicam (MOBIC) 15 MG tablet TAKE 1 TABLET (15 MG TOTAL) BY MOUTH DAILY.   MULTIPLE VITAMIN PO Take by mouth daily.   Omega-3 Fatty Acids (FISH OIL) 1000 MG CAPS Take by mouth daily.    SUMAtriptan (IMITREX) 50 MG tablet Take 1 tablet by mouth as needed.   No facility-administered medications prior to visit.    Review of Systems  Constitutional:  Negative for fatigue and fever.  Respiratory:  Negative for cough and shortness of breath.   Cardiovascular:  Negative for chest pain and leg swelling.  Gastrointestinal:  Negative for abdominal pain.  Neurological:  Negative for dizziness and headaches.      Objective    Blood pressure 115/79, pulse 87, height _0  (1.575 m), weight 279 lb 6.4 oz (126.7 kg), SpO2 100 %.   Physical Exam Vitals reviewed.  Constitutional:      Appearance: She is not ill-appearing.  HENT:     Head: Normocephalic.  Eyes:     Conjunctiva/sclera: Conjunctivae normal.  Cardiovascular:     Rate and Rhythm: Normal rate.  Pulmonary:     Effort: Pulmonary effort is normal. No respiratory distress.  Neurological:     General: No focal  deficit present.     Mental Status: She is alert and oriented to person, place, and time.  Psychiatric:        Mood and Affect: Mood normal.        Behavior: Behavior normal.      No results found for any visits on 01/13/22.  Assessment & Plan     Problem List Items Addressed This Visit       Cardiovascular and Mediastinum   Hypertension - Primary    Pt managed on diltiazem 120 mg for htn/afib Well controlled Reviewed cmp  F/u 6 mo        Return in about 6 months (around 07/14/2022) for CPE.      I, Karen Kirschner, PA-C have reviewed all documentation for  this visit. The documentation on  01/13/2022  for the exam, diagnosis, procedures, and orders are all accurate and complete.  Karen Kirschner, PA-C [facility] [address] [place], [place], [address] Office: [phone] Fax: [phone]

## 2022-01-13 ENCOUNTER — Ambulatory Visit (INDEPENDENT_AMBULATORY_CARE_PROVIDER_SITE_OTHER): Payer: PPO | Admitting: Physician Assistant

## 2022-01-13 ENCOUNTER — Encounter: Payer: Self-pay | Admitting: Physician Assistant

## 2022-01-13 VITALS — BP 115/79 | HR 87 | Ht 62.0 in | Wt 279.4 lb

## 2022-01-13 DIAGNOSIS — I10 Essential (primary) hypertension: Secondary | ICD-10-CM

## 2022-01-13 NOTE — Assessment & Plan Note (Addendum)
Pt managed on diltiazem 120 mg for htn/afib Well controlled Reviewed cmp  F/u 6 mo

## 2022-01-15 ENCOUNTER — Other Ambulatory Visit: Payer: Self-pay | Admitting: Physician Assistant

## 2022-01-15 DIAGNOSIS — M1711 Unilateral primary osteoarthritis, right knee: Secondary | ICD-10-CM

## 2022-01-15 NOTE — Telephone Encounter (Signed)
Refilled 12/02/2021 #[id number] rf. Requested Prescriptions  Pending Prescriptions Disp Refills   meloxicam (MOBIC) 15 MG tablet [Pharmacy Med Name: MELOXICAM 15 MG TABLET] 90 tablet 0    Sig: TAKE 1 TABLET (15 MG TOTAL) BY MOUTH DAILY.     Analgesics:  COX2 Inhibitors Failed - 01/15/2022  3:33 PM      Failed - Manual Review: Labs are only required if the patient has taken medication for more than 8 weeks.      Passed - HGB in normal range and within 360 days    Hemoglobin  Date Value Ref Range Status  07/09/2021 12.0 11.1 - 15.9 g/dL Final         Passed - Cr in normal range and within 360 days    Creatinine, Ser  Date Value Ref Range Status  07/09/2021 0.82 0.57 - 1.00 mg/dL Final         Passed - HCT in normal range and within 360 days    Hematocrit  Date Value Ref Range Status  07/09/2021 37.0 34.0 - 46.6 % Final         Passed - AST in normal range and within 360 days    AST  Date Value Ref Range Status  07/09/2021 24 0 - 40 IU/L Final         Passed - ALT in normal range and within 360 days    ALT  Date Value Ref Range Status  07/09/2021 11 0 - 32 IU/L Final         Passed - eGFR is 30 or above and within 360 days    GFR calc Af Amer  Date Value Ref Range Status  07/12/2019 88 >59 mL/min/1.73 Final    Comment:    **[facility] currently reports eGFR in compliance with the current**   recommendations of the [employer]. [facility] will   update reporting as new guidelines are published from the NKF-ASN   Task force.    GFR calc non Af Amer  Date Value Ref Range Status  07/12/2019 77 >59 mL/min/1.73 Final   eGFR  Date Value Ref Range Status  07/09/2021 77 >59 mL/min/1.73 Final         Passed - Patient is not pregnant      Passed - Valid encounter within last 12 months    Recent Outpatient Visits           2 days ago Hypertension, unspecified type   [facility] Thedore Mins, [place], PA-C   1 month ago Upper respiratory tract infection,  unspecified type   [facility] Thedore Mins, [place], PA-C   6 months ago Annual physical exam   [facility] [name], PA-C   11 months ago Hypertension, unspecified type   [facility] [name], PA-C   1 year ago Annual physical exam   [place], PA-C       Future Appointments             In 5 months Thedore Mins, Ria Comment, PA-C [employer], [place]

## 2022-05-10 ENCOUNTER — Ambulatory Visit (INDEPENDENT_AMBULATORY_CARE_PROVIDER_SITE_OTHER): Payer: PPO

## 2022-05-10 VITALS — Ht 62.0 in | Wt 279.0 lb

## 2022-05-10 DIAGNOSIS — Z0000 Encounter for general adult medical examination without abnormal findings: Secondary | ICD-10-CM

## 2022-05-10 NOTE — Progress Notes (Signed)
I connected with  Caryl Asp on 05/10/22 by a audio enabled telemedicine application and verified that I am speaking with the correct person using two identifiers.  Patient Location: Home  Provider Location: Home Office  I discussed the limitations of evaluation and management by telemedicine. The patient expressed understanding and agreed to proceed.  Subjective:   Karen [name] is a 72 y.o. female who presents for Medicare Annual (Subsequent) preventive examination.  Review of Systems    Cardiac Risk Factors include: advanced age (>56men, >74 women);dyslipidemia;hypertension;obesity (BMI >30kg/m2)     Objective:    Today's Vitals   05/10/22 0950 05/10/22 0956  Weight: 279 lb (126.6 kg)   Height: 5\' 2"  (1.575 m)   PainSc:  6    Body mass index is 51.03 kg/m.     05/10/2022    9:59 AM 05/05/2021   10:32 AM 04/10/2020    2:45 PM 10/17/2019   10:14 AM 04/05/2019    2:12 PM 02/05/2016    5:45 PM 10/24/2014    9:02 AM  Advanced Directives  Does Patient Have a Medical Advance Directive? No No No No No No No  Would patient like information on creating a medical advance directive?  No - Patient declined No - Patient declined  No - Patient declined      Current Medications (verified) Outpatient Encounter Medications as of 05/10/2022  Medication Sig   apixaban (ELIQUIS) 5 MG TABS tablet Take 5 mg by mouth 2 (two) times daily.   aspirin-acetaminophen-caffeine (EXCEDRIN MIGRAINE) 250-250-65 MG tablet Take by mouth as needed for headache.   atorvastatin (LIPITOR) 40 MG tablet TAKE 1 TABLET BY MOUTH EVERY DAY   b complex vitamins capsule Take 1 capsule by mouth daily.    Calcium Carbonate (CALCIUM 600 PO) Take by mouth daily.   cholecalciferol (VITAMIN D) 1000 UNITS tablet Take 1,000 Units by mouth daily.    diltiazem (CARDIZEM CD) 120 MG 24 hr capsule Take by mouth.   meloxicam (MOBIC) 15 MG tablet TAKE 1 TABLET (15 MG TOTAL) BY MOUTH DAILY.   MULTIPLE VITAMIN PO Take by mouth  daily.   Omega-3 Fatty Acids (FISH OIL) 1000 MG CAPS Take by mouth daily.    SUMAtriptan (IMITREX) 50 MG tablet Take 1 tablet by mouth as needed.   No facility-administered encounter medications on file as of 05/10/2022.    Allergies (verified) Tetanus toxoid   History: Past Medical History:  Diagnosis Date   A-fib [facility])    Allergy 1990   Anemia    Arthritis 2015   Cataract 2017   Hyperlipidemia    Stroke (HCC) 1991   Vitamin D deficiency    Past Surgical History:  Procedure Laterality Date   ABDOMINAL HYSTERECTOMY     CHOLECYSTECTOMY     CHOLECYSTECTOMY  01/11/1985   COLONOSCOPY WITH PROPOFOL N/A 10/17/2019   Procedure: COLONOSCOPY WITH PROPOFOL;  Surgeon: Earline Mayotte, MD;  Location: [facility] ENDOSCOPY;  Service: Endoscopy;  Laterality: N/A;   GASTROPLASTY  01/11/1985   HERNIA REPAIR     Family History  Problem Relation Age of Onset   Hypertension Mother    Cancer Mother    Arthritis Mother    Depression Mother    Heart disease Mother    Obesity Mother    Stroke Mother    Heart disease Father    Breast cancer Sister    Cancer Sister    Pancreatic cancer Brother    Cancer Paternal Grandfather    Hyperlipidemia  Brother    Hypertension Brother    Cancer Brother    Heart disease Maternal Grandmother    Stroke Maternal Grandmother    Arthritis Maternal Grandmother    Breast cancer Maternal Aunt    Cancer Maternal Aunt    Heart disease Maternal Grandfather    Heart disease Paternal Grandmother    Arthritis Maternal Aunt    Arthritis Brother    Hearing loss Brother    Heart disease Brother    Hypertension Brother    Social History   Socioeconomic History   Marital status: Married    Spouse name: Not on file   Number of children: 0   Years of education: Not on file   Highest education level: High school graduate  Occupational History   Occupation: retired  Tobacco Use   Smoking status: Never   Smokeless tobacco: Never  Building services engineer Use:  Never used  Substance and Sexual Activity   Alcohol use: Not Currently    Comment: rarely Liqueur   Drug use: Never   Sexual activity: Not Currently    Birth control/protection: Post-menopausal  Other Topics Concern   Not on file  Social History Narrative   Not on file   Social Determinants of Health   Financial Resource Strain: Low Risk  (05/06/2022)   Overall Financial Resource Strain (CARDIA)    Difficulty of Paying Living Expenses: Not hard at all  Food Insecurity: No Food Insecurity (05/06/2022)   Hunger Vital Sign    Worried About Running Out of Food in the Last Year: Never true    Ran Out of Food in the Last Year: Never true  Transportation Needs: No Transportation Needs (05/06/2022)   PRAPARE - Administrator, Civil Service (Medical): No    Lack of Transportation (Non-Medical): No  Physical Activity: Insufficiently Active (05/06/2022)   Exercise Vital Sign    Days of Exercise per Week: 3 days    Minutes of Exercise per Session: 20 min  Stress: No Stress Concern Present (05/06/2022)   [employer] of Occupational Health - Occupational Stress Questionnaire    Feeling of Stress : Not at all  Social Connections: Unknown (05/06/2022)   Social Connection and Isolation Panel [NHANES]    Frequency of Communication with Friends and Family: More than three times a week    Frequency of Social Gatherings with Friends and Family: Twice a week    Attends Religious Services: Not on Marketing executive or Organizations: No    Attends Banker Meetings: Never    Marital Status: Married    Tobacco Counseling Counseling given: Not Answered   Clinical Intake:  Pre-visit preparation completed: Yes  Pain : 0-10 Pain Score: 6  Pain Type: Chronic pain Pain Location: Knee (both knees) Pain Orientation: Left, Right Pain Descriptors / Indicators: Aching Pain Relieving Factors: movement, mobic  Pain Relieving Factors: movement, mobic  BMI -  recorded: 51.03 Nutritional Risks: None Diabetes: No  How often do you need to have someone help you when you read instructions, pamphlets, or other written materials from your doctor or pharmacy?: 1 - Never  Diabetic?no  Interpreter Needed?: No  Comments: lives with husband Information entered by :: B.Calisha Tindel,LPN   Activities of Daily Living    05/06/2022    7:49 PM 07/09/2021    9:56 AM  In your present state of health, do you have any difficulty performing the following activities:  Hearing? 0 0  Vision? 0 0  Difficulty concentrating or making decisions? 0 0  Walking or climbing stairs? 1 1  Dressing or bathing? 0 0  Doing errands, shopping? 0 0  Preparing Food and eating ? N   Using the Toilet? N   In the past six months, have you accidently leaked urine? N   Do you have problems with loss of bowel control? Y   Managing your Medications? N   Managing your Finances? N   Housekeeping or managing your Housekeeping? N     Patient Care Team: Burnett Corrente as PCP - General (Physician Assistant) Eli Phillips, OD (Optometry) Lady Gary Darlin Priestly, MD as Consulting Physician (Cardiology) Lemar Livings Merrily Pew, MD as Consulting Physician (General Surgery) Buck Mam, DDS as Referring Physician (Dentistry)  Indicate any recent Medical Services you may have received from other than [facility] providers in the past year (date may be approximate).     Assessment:   This is a routine wellness examination for Mar[place].  Hearing/Vision screen Hearing Screening - Comments:: Adequate hearing Vision Screening - Comments:: Adequate vision:reading glasses Dr Mechele Collin (last week)  Dietary issues and exercise activities discussed: Current Exercise Habits: Home exercise routine, Type of exercise: walking, Time (Minutes): 30, Frequency (Times/Week): 3, Weekly Exercise (Minutes/Week): 90, Intensity: Mild, Exercise limited by: orthopedic condition(s)   Goals Addressed              This Visit's Progress    DIET - EAT MORE FRUITS AND VEGETABLES   On track    Exercise 3x per week (30 min per time)   On track    Recommend to exercise for 3 days a week for at least 30 minutes at a time.        Depression Screen    05/10/2022    9:55 AM 07/09/2021    9:56 AM 05/05/2021   10:29 AM 02/12/2021    3:57 PM 07/07/2020   11:36 AM 04/10/2020    2:42 PM 04/05/2019    2:06 PM  PHQ 2/9 Scores  PHQ - 2 Score 0 0 0 0 0 0 0  PHQ- 9 Score  0  1 0      Fall Risk    05/06/2022    7:49 PM 07/09/2021    9:55 AM 05/05/2021   10:33 AM 05/01/2021    9:00 AM 04/21/2021    9:59 AM  Fall Risk   Falls in the past year? 0 0 0 0 0  Number falls in past yr: 0 0 0    Injury with Fall? 0 0 0    Risk for fall due to :  No Fall Risks No Fall Risks    Follow up   Falls evaluation completed      FALL RISK PREVENTION PERTAINING TO THE HOME:  Any stairs in or around the home? Yes  4 outside If so, are there any without handrails? Yes  Home free of loose throw rugs in walkways, pet beds, electrical cords, etc? Yes  Adequate lighting in your home to reduce risk of falls? Yes   ASSISTIVE DEVICES UTILIZED TO PREVENT FALLS:  Life alert? No  Use of a cane, walker or w/c? No walking stick Grab bars in the bathroom? Yes  Shower chair or bench in shower? No  Elevated toilet seat or a handicapped toilet? No    Cognitive Function:        05/10/2022   10:05 AM 04/05/2019    2:18 PM  6CIT Screen  What Year? 0 points 0 points  What month? 0 points 0 points  What time? 0 points 0 points  Count back from 20 0 points 0 points  Months in reverse 0 points 0 points  Repeat phrase 0 points 0 points  Total Score 0 points 0 points    Immunizations Immunization History  Administered Date(s) Administered   Fluad Quad(high Dose 65+) 09/28/2018, 12/26/2019, 11/05/2020, 11/05/2021   Influenza Split 11/08/2008, 02/23/2011, 11/23/2011   Influenza, High Dose Seasonal PF 11/03/2017   Influenza,inj,Quad  PF,6+ Mos 11/08/2013, 10/24/2014, 10/15/2016   Influenza-Unspecified 11/08/2013   Moderna Sars-Covid-2 Vaccination 03/24/2019, 04/21/2019   PFIZER(Purple Top)SARS-COV-2 Vaccination 12/26/2019   Pneumococcal Conjugate-13 05/10/2017, 11/03/2017   Pneumococcal Polysaccharide-23 07/06/2019   Zoster, Live 03/20/2015    TDAP status: Up to date  Flu Vaccine status: Up to date  Pneumococcal vaccine status: Up to date  Covid-19 vaccine status: Completed vaccines  Qualifies for Shingles Vaccine? Yes   Zostavax completed No   Shingrix Completed?: No.    Education has been provided regarding the importance of this vaccine. Patient has been advised to call insurance company to determine out of pocket expense if they have not yet received this vaccine. Advised may also receive vaccine at local pharmacy or Health Dept. Verbalized acceptance and understanding.  Screening Tests Health Maintenance  Topic Date Due   Zoster Vaccines- Shingrix (1 of 2) Never done   COVID-19 Vaccine (4 - 2023-24 season) 09/11/2021   MAMMOGRAM  07/28/2022   Fecal DNA (Cologuard)  08/07/2022   INFLUENZA VACCINE  08/12/2022   Medicare Annual Wellness (AWV)  05/10/2023   DEXA SCAN  09/18/2026   Pneumonia Vaccine 71+ Years old  Completed   Hepatitis C Screening  Completed   HPV VACCINES  Aged Out   DTaP/Tdap/Td  Discontinued   COLONOSCOPY (Pts 45-36yrs Insurance coverage will need to be confirmed)  Discontinued    Health Maintenance  Health Maintenance Due  Topic Date Due   Zoster Vaccines- Shingrix (1 of 2) Never done   COVID-19 Vaccine (4 - 2023-24 season) 09/11/2021    Colorectal cancer screening: Type of screening: Cologuard. Completed yes. Repeat every 3 years  Mammogram status: Completed yes. Repeat every year due July 2024  Bone Density status: Completed yes. Results reflect: Bone density results: NORMAL. Repeat every 5 years.  Lung Cancer Screening: (Low Dose CT Chest recommended if Age 78-80 years,  30 pack-year currently smoking OR have quit w/in 15years.) does not qualify.   Lung Cancer Screening Referral: no  Additional Screening:  Hepatitis C Screening: does not qualify; Completed yes  Vision Screening: Recommended annual ophthalmology exams for early detection of glaucoma and other disorders of the eye. Is the patient up to date with their annual eye exam?  Yes  Who is the provider or what is the name of the office in which the patient attends annual eye exams? Dr Mechele Collin If pt is not established with a provider, would they like to be referred to a provider to establish care? No .   Dental Screening: Recommended annual dental exams for proper oral hygiene  Community Resource Referral / Chronic Care Management: CRR required this visit?  No   CCM required this visit?  No      Plan:     I have personally reviewed and noted the following in the patient's chart:   Medical and social history Use of alcohol, tobacco or illicit drugs  Current medications and supplements including opioid prescriptions. Patient is not  currently taking opioid prescriptions. Functional ability and status Nutritional status Physical activity Advanced directives List of other physicians Hospitalizations, surgeries, and ER visits in previous 12 months Vitals Screenings to include cognitive, depression, and falls Referrals and appointments  In addition, I have reviewed and discussed with patient certain preventive protocols, quality metrics, and best practice recommendations. A written personalized care plan for preventive services as well as general preventive health recommendations were provided to patient.     Sue Lush, LPN   1/61/0960   Nurse Notes: pt says she is doing well other than chronic pain in both knees that she manages well most days. She also reports a little 2 second dizziness every other day starting when placed on Eliquis. She says it does not cause any problems in  daily activities. She will discuss with her new cardiologist (old retired). Encouraged pt to get up slower and make no sudden moves if possible in walking/getting out of bed.

## 2022-05-10 NOTE — Patient Instructions (Signed)
Karen [name] , Thank you for taking time to come for your Medicare Wellness Visit. I appreciate your ongoing commitment to your health goals. Please review the following plan we discussed and let me know if I can assist you in the future.   These are the goals we discussed:  Goals      DIET - EAT MORE FRUITS AND VEGETABLES     Exercise 3x per week (30 min per time)     Recommend to exercise for 3 days a week for at least 30 minutes at a time.         This is a list of the screening recommended for you and due dates:  Health Maintenance  Topic Date Due   Zoster (Shingles) Vaccine (1 of 2) Never done   COVID-19 Vaccine (4 - 2023-24 season) 09/11/2021   Mammogram  07/28/2022   Cologuard (Stool DNA test)  08/07/2022   Flu Shot  08/12/2022   Medicare Annual Wellness Visit  05/10/2023   DEXA scan (bone density measurement)  09/18/2026   Pneumonia Vaccine  Completed   Hepatitis C Screening: USPSTF Recommendation to screen - Ages 61-79 yo.  Completed   HPV Vaccine  Aged Out   DTaP/Tdap/Td vaccine  Discontinued   Colon Cancer Screening  Discontinued    Advanced directives: no  Conditions/risks identified: none  Next appointment: Follow up in one year for your annual wellness visit 05/11/2023 @9 :45am telephone   Preventive Care 65 Years and Older, Female Preventive care refers to lifestyle choices and visits with your health care provider that can promote health and wellness. What does preventive care include? A yearly physical exam. This is also called an annual well check. Dental exams once or twice a year. Routine eye exams. Ask your health care provider how often you should have your eyes checked. Personal lifestyle choices, including: Daily care of your teeth and gums. Regular physical activity. Eating a healthy diet. Avoiding tobacco and drug use. Limiting alcohol use. Practicing safe sex. Taking low-dose aspirin every day. Taking vitamin and mineral supplements as  recommended by your health care provider. What happens during an annual well check? The services and screenings done by your health care provider during your annual well check will depend on your age, overall health, lifestyle risk factors, and family history of disease. Counseling  Your health care provider may ask you questions about your: Alcohol use. Tobacco use. Drug use. Emotional well-being. Home and relationship well-being. Sexual activity. Eating habits. History of falls. Memory and ability to understand (cognition). Work and work Astronomer. Reproductive health. Screening  You may have the following tests or measurements: Height, weight, and BMI. Blood pressure. Lipid and cholesterol levels. These may be checked every 5 years, or more frequently if you are over 52 years old. Skin check. Lung cancer screening. You may have this screening every year starting at age 76 if you have a 30-pack-year history of smoking and currently smoke or have quit within the past 15 years. Fecal occult blood test (FOBT) of the stool. You may have this test every year starting at age 45. Flexible sigmoidoscopy or colonoscopy. You may have a sigmoidoscopy every 5 years or a colonoscopy every 10 years starting at age 29. Hepatitis C blood test. Hepatitis B blood test. Sexually transmitted disease (STD) testing. Diabetes screening. This is done by checking your blood sugar (glucose) after you have not eaten for a while (fasting). You may have this done every 1-3 years. Bone density scan. This  is done to screen for osteoporosis. You may have this done starting at age 60. Mammogram. This may be done every 1-2 years. Talk to your health care provider about how often you should have regular mammograms. Talk with your health care provider about your test results, treatment options, and if necessary, the need for more tests. Vaccines  Your health care provider may recommend certain vaccines, such  as: Influenza vaccine. This is recommended every year. Tetanus, diphtheria, and acellular pertussis (Tdap, Td) vaccine. You may need a Td booster every 10 years. Zoster vaccine. You may need this after age 45. Pneumococcal 13-valent conjugate (PCV13) vaccine. One dose is recommended after age 67. Pneumococcal polysaccharide (PPSV23) vaccine. One dose is recommended after age 27. Talk to your health care provider about which screenings and vaccines you need and how often you need them. This information is not intended to replace advice given to you by your health care provider. Make sure you discuss any questions you have with your health care provider. Document Released: 01/24/2015 Document Revised: 09/17/2015 Document Reviewed: 10/29/2014 Elsevier Interactive Patient Education  2017 [employer] Prevention in the Home Falls can cause injuries. They can happen to people of all ages. There are many things you can do to make your home safe and to help prevent falls. What can I do on the outside of my home? Regularly fix the edges of walkways and driveways and fix any cracks. Remove anything that might make you trip as you walk through a door, such as a raised step or threshold. Trim any bushes or trees on the path to your home. Use bright outdoor lighting. Clear any walking paths of anything that might make someone trip, such as rocks or tools. Regularly check to see if handrails are loose or broken. Make sure that both sides of any steps have handrails. Any raised decks and porches should have guardrails on the edges. Have any leaves, snow, or ice cleared regularly. Use sand or salt on walking paths during winter. Clean up any spills in your garage right away. This includes oil or grease spills. What can I do in the bathroom? Use night lights. Install grab bars by the toilet and in the tub and shower. Do not use towel bars as grab bars. Use non-skid mats or decals in the tub or  shower. If you need to sit down in the shower, use a plastic, non-slip stool. Keep the floor dry. Clean up any water that spills on the floor as soon as it happens. Remove soap buildup in the tub or shower regularly. Attach bath mats securely with double-sided non-slip rug tape. Do not have throw rugs and other things on the floor that can make you trip. What can I do in the bedroom? Use night lights. Make sure that you have a light by your bed that is easy to reach. Do not use any sheets or blankets that are too big for your bed. They should not hang down onto the floor. Have a firm chair that has side arms. You can use this for support while you get dressed. Do not have throw rugs and other things on the floor that can make you trip. What can I do in the kitchen? Clean up any spills right away. Avoid walking on wet floors. Keep items that you use a lot in easy-to-reach places. If you need to reach something above you, use a strong step stool that has a grab bar. Keep electrical cords out  of the way. Do not use floor polish or wax that makes floors slippery. If you must use wax, use non-skid floor wax. Do not have throw rugs and other things on the floor that can make you trip. What can I do with my stairs? Do not leave any items on the stairs. Make sure that there are handrails on both sides of the stairs and use them. Fix handrails that are broken or loose. Make sure that handrails are as long as the stairways. Check any carpeting to make sure that it is firmly attached to the stairs. Fix any carpet that is loose or worn. Avoid having throw rugs at the top or bottom of the stairs. If you do have throw rugs, attach them to the floor with carpet tape. Make sure that you have a light switch at the top of the stairs and the bottom of the stairs. If you do not have them, ask someone to add them for you. What else can I do to help prevent falls? Wear shoes that: Do not have high heels. Have  rubber bottoms. Are comfortable and fit you well. Are closed at the toe. Do not wear sandals. If you use a stepladder: Make sure that it is fully opened. Do not climb a closed stepladder. Make sure that both sides of the stepladder are locked into place. Ask someone to hold it for you, if possible. Clearly mark and make sure that you can see: Any grab bars or handrails. First and last steps. Where the edge of each step is. Use tools that help you move around (mobility aids) if they are needed. These include: Canes. Walkers. Scooters. Crutches. Turn on the lights when you go into a dark area. Replace any light bulbs as soon as they burn out. Set up your furniture so you have a clear path. Avoid moving your furniture around. If any of your floors are uneven, fix them. If there are any pets around you, be aware of where they are. Review your medicines with your doctor. Some medicines can make you feel dizzy. This can increase your chance of falling. Ask your doctor what other things that you can do to help prevent falls. This information is not intended to replace advice given to you by your health care provider. Make sure you discuss any questions you have with your health care provider. Document Released: 10/24/2008 Document Revised: 06/05/2015 Document Reviewed: 02/01/2014 Elsevier Interactive Patient Education  2017 [employer].

## 2022-06-14 ENCOUNTER — Other Ambulatory Visit: Payer: Self-pay | Admitting: Physician Assistant

## 2022-06-14 DIAGNOSIS — Z1231 Encounter for screening mammogram for malignant neoplasm of breast: Secondary | ICD-10-CM

## 2022-07-06 DIAGNOSIS — I480 Paroxysmal atrial fibrillation: Secondary | ICD-10-CM | POA: Diagnosis not present

## 2022-07-06 DIAGNOSIS — E7800 Pure hypercholesterolemia, unspecified: Secondary | ICD-10-CM | POA: Diagnosis not present

## 2022-07-06 DIAGNOSIS — I10 Essential (primary) hypertension: Secondary | ICD-10-CM | POA: Diagnosis not present

## 2022-07-06 DIAGNOSIS — Z8673 Personal history of transient ischemic attack (TIA), and cerebral infarction without residual deficits: Secondary | ICD-10-CM | POA: Diagnosis not present

## 2022-07-12 ENCOUNTER — Encounter: Payer: Self-pay | Admitting: Physician Assistant

## 2022-07-12 ENCOUNTER — Ambulatory Visit (INDEPENDENT_AMBULATORY_CARE_PROVIDER_SITE_OTHER): Payer: PPO | Admitting: Physician Assistant

## 2022-07-12 VITALS — BP 118/76 | HR 76 | Ht 62.0 in | Wt 285.5 lb

## 2022-07-12 DIAGNOSIS — G8929 Other chronic pain: Secondary | ICD-10-CM

## 2022-07-12 DIAGNOSIS — I480 Paroxysmal atrial fibrillation: Secondary | ICD-10-CM

## 2022-07-12 DIAGNOSIS — E559 Vitamin D deficiency, unspecified: Secondary | ICD-10-CM | POA: Diagnosis not present

## 2022-07-12 DIAGNOSIS — H6992 Unspecified Eustachian tube disorder, left ear: Secondary | ICD-10-CM | POA: Diagnosis not present

## 2022-07-12 DIAGNOSIS — L209 Atopic dermatitis, unspecified: Secondary | ICD-10-CM

## 2022-07-12 DIAGNOSIS — M25561 Pain in right knee: Secondary | ICD-10-CM | POA: Diagnosis not present

## 2022-07-12 DIAGNOSIS — Z1211 Encounter for screening for malignant neoplasm of colon: Secondary | ICD-10-CM

## 2022-07-12 DIAGNOSIS — R739 Hyperglycemia, unspecified: Secondary | ICD-10-CM | POA: Diagnosis not present

## 2022-07-12 DIAGNOSIS — M25562 Pain in left knee: Secondary | ICD-10-CM

## 2022-07-12 DIAGNOSIS — Z0000 Encounter for general adult medical examination without abnormal findings: Secondary | ICD-10-CM | POA: Diagnosis not present

## 2022-07-12 DIAGNOSIS — E782 Mixed hyperlipidemia: Secondary | ICD-10-CM | POA: Diagnosis not present

## 2022-07-12 DIAGNOSIS — I10 Essential (primary) hypertension: Secondary | ICD-10-CM

## 2022-07-12 NOTE — Progress Notes (Signed)
Complete physical exam   Patient: Karen [name]   DOB: 09-07-1950   72 y.o. Female  MRN: [id number] Visit Date: 07/12/2022  Today's healthcare provider: Alfredia Ferguson, PA-C   Chief Complaint  Patient presents with   Annual Exam   Subjective    Karen [name] is a 72 y.o. female who presents today for a complete physical exam.  Discussed the use of AI scribe software for clinical note transcription with the patient, who gave verbal consent to proceed.  History of Present Illness   The patient presents with occasional bilateral knee pain, she has questions about cortisone injections.   The patient also reports a sensation in her left ear, described as a hollow kind of feeling, which seems to be alleviated by moving her head in a certain way. This sensation is not associated with any pain or dizziness.  The patient also mentions a skin reaction to the adhesive from the heart monitor, which she has been treating with Neosporin.       Past Medical History:  Diagnosis Date   A-fib [facility])    Allergy 1990   Anemia    Arthritis 2015   Cataract 2017   Hyperlipidemia    Stroke [facility]) 1991   Vitamin D deficiency    Past Surgical History:  Procedure Laterality Date   ABDOMINAL HYSTERECTOMY     CHOLECYSTECTOMY     CHOLECYSTECTOMY  01/11/1985   COLONOSCOPY WITH PROPOFOL N/A 10/17/2019   Procedure: COLONOSCOPY WITH PROPOFOL;  Surgeon: Earline Mayotte, MD;  Location: [facility] ENDOSCOPY;  Service: Endoscopy;  Laterality: N/A;   GASTROPLASTY  01/11/1985   HERNIA REPAIR     Social History   Socioeconomic History   Marital status: Married    Spouse name: Not on file   Number of children: 0   Years of education: Not on file   Highest education level: High school graduate  Occupational History   Occupation: retired  Tobacco Use   Smoking status: Never   Smokeless tobacco: Never  Vaping Use   Vaping Use: Never used  Substance and Sexual Activity   Alcohol use: Not Currently     Comment: rarely Liqueur   Drug use: Never   Sexual activity: Not Currently    Birth control/protection: Post-menopausal  Other Topics Concern   Not on file  Social History Narrative   Not on file   Social Determinants of Health   Financial Resource Strain: Low Risk  (05/06/2022)   Overall Financial Resource Strain (CARDIA)    Difficulty of Paying Living Expenses: Not hard at all  Food Insecurity: No Food Insecurity (05/06/2022)   Hunger Vital Sign    Worried About Running Out of Food in the Last Year: Never true    Ran Out of Food in the Last Year: Never true  Transportation Needs: No Transportation Needs (05/06/2022)   PRAPARE - Administrator, Civil Service (Medical): No    Lack of Transportation (Non-Medical): No  Physical Activity: Insufficiently Active (05/06/2022)   Exercise Vital Sign    Days of Exercise per Week: 3 days    Minutes of Exercise per Session: 20 min  Stress: No Stress Concern Present (05/06/2022)   [employer] of Occupational Health - Occupational Stress Questionnaire    Feeling of Stress : Not at all  Social Connections: Unknown (05/06/2022)   Social Connection and Isolation Panel [NHANES]    Frequency of Communication with Friends and Family: More than three times  a week    Frequency of Social Gatherings with Friends and Family: Twice a week    Attends Religious Services: Not on file    Active Member of Clubs or Organizations: No    Attends Banker Meetings: Never    Marital Status: Married  Catering manager Violence: Not At Risk (05/10/2022)   Humiliation, Afraid, Rape, and Kick questionnaire    Fear of Current or Ex-Partner: No    Emotionally Abused: No    Physically Abused: No    Sexually Abused: No   Family Status  Relation Name Status   Mother Angus Seller Deceased   Father Alfredo Bach Deceased   Sister Alden Hipp [facility] Deceased   Brother  Deceased   PGF Leland Her Deceased   Brother Threasa Beards Alive   MGM Gilda Crease (Not Specified)   Mat Aunt Shelly Rubenstein (Not Specified)   MGF Leanna Battles (Not Specified)   PGM Clara Lucretia Roers (Not Specified)   Mat Aunt Patrecia Pace (Not Specified)   Brother Jeoffrey Massed (Not Specified)   Family History  Problem Relation Age of Onset   Hypertension Mother    Cancer Mother    Arthritis Mother    Depression Mother    Heart disease Mother    Obesity Mother    Stroke Mother    Heart disease Father    Breast cancer Sister    Cancer Sister    Pancreatic cancer Brother    Cancer Paternal Grandfather    Hyperlipidemia Brother    Hypertension Brother    Cancer Brother    Heart disease Maternal Grandmother    Stroke Maternal Grandmother    Arthritis Maternal Grandmother    Breast cancer Maternal Aunt    Cancer Maternal Aunt    Heart disease Maternal Grandfather    Heart disease Paternal Grandmother    Arthritis Maternal Aunt    Arthritis Brother    Hearing loss Brother    Heart disease Brother    Hypertension Brother    Allergies  Allergen Reactions   Tetanus Toxoid     Multiple joint pains.    Patient Care Team: Sherlyn Hay, DO as PCP - General (Family Medicine) Eli Phillips, OD (Optometry) Lady Gary Darlin Priestly, MD as Consulting Physician (Cardiology) Lemar Livings Merrily Pew, MD as Consulting Physician (General Surgery) Buck Mam, DDS as Referring Physician (Dentistry)   Medications: Outpatient Medications Prior to Visit  Medication Sig   apixaban (ELIQUIS) 5 MG TABS tablet Take 5 mg by mouth 2 (two) times daily.   aspirin-acetaminophen-caffeine (EXCEDRIN MIGRAINE) 250-250-65 MG tablet Take by mouth as needed for headache.   atorvastatin (LIPITOR) 40 MG tablet TAKE 1 TABLET BY MOUTH EVERY DAY   b complex vitamins capsule Take 1 capsule by mouth daily.    Calcium Carbonate (CALCIUM 600 PO) Take by mouth daily.   cholecalciferol (VITAMIN D) 1000 UNITS tablet Take 1,000 Units by mouth daily.    diltiazem (CARDIZEM CD) 120 MG 24 hr  capsule Take by mouth.   meloxicam (MOBIC) 15 MG tablet TAKE 1 TABLET (15 MG TOTAL) BY MOUTH DAILY.   MULTIPLE VITAMIN PO Take by mouth daily.   Omega-3 Fatty Acids (FISH OIL) 1000 MG CAPS Take by mouth daily.    [DISCONTINUED] SUMAtriptan (IMITREX) 50 MG tablet Take 1 tablet by mouth as needed.   No facility-administered medications prior to visit.   Review of Systems  Constitutional:  Negative for fatigue and fever.  Respiratory:  Negative for cough and  shortness of breath.   Cardiovascular:  Negative for chest pain and leg swelling.  Gastrointestinal:  Negative for abdominal pain.  Neurological:  Negative for dizziness and headaches.    Objective    BP 118/76 (BP Location: Left Wrist, Patient Position: Sitting, Cuff Size: Normal)   Pulse 76   Ht 5\' 2"  (1.575 m)   Wt 285 lb 8 oz (129.5 kg)   SpO2 100%   BMI 52.22 kg/m   Physical Exam Constitutional:      General: She is awake.     Appearance: She is well-developed.  HENT:     Head: Normocephalic.  Eyes:     Conjunctiva/sclera: Conjunctivae normal.  Cardiovascular:     Rate and Rhythm: Normal rate. Rhythm irregular.  Pulmonary:     Effort: Pulmonary effort is normal.     Breath sounds: Normal breath sounds.  Skin:    General: Skin is warm.  Neurological:     Mental Status: She is alert and oriented to person, place, and time.  Psychiatric:        Attention and Perception: Attention normal.        Mood and Affect: Mood normal.        Speech: Speech normal.        Behavior: Behavior is cooperative.     Last depression screening scores    07/12/2022   10:07 AM 05/10/2022    9:55 AM 07/09/2021    9:56 AM  PHQ 2/9 Scores  PHQ - 2 Score 0 0 0  PHQ- 9 Score   0   Last fall risk screening    07/12/2022   10:07 AM  Fall Risk   Falls in the past year? 0  Injury with Fall? 0  Risk for fall due to : No Fall Risks  Follow up Falls evaluation completed   Last Audit-C alcohol use screening    05/06/2022    7:49 PM   Alcohol Use Disorder Test (AUDIT)  1. How often do you have a drink containing alcohol? 1  2. How many drinks containing alcohol do you have on a typical day when you are drinking? 0  3. How often do you have six or more drinks on one occasion? 0  AUDIT-C Score 1   A score of 3 or more in women, and 4 or more in men indicates increased risk for alcohol abuse, EXCEPT if all of the points are from question 1   No results found for any visits on 07/12/22.  Assessment & Plan    Routine Health Maintenance and Physical Exam  Exercise Activities and Dietary recommendations --balanced diet high in fiber and protein, low in sugars, carbs, fats. --physical activity/exercise 30 minutes 3-5 times a week     Immunization History  Administered Date(s) Administered   Fluad Quad(high Dose 65+) 09/28/2018, 12/26/2019, 11/05/2020, 11/05/2021   Influenza Split 11/08/2008, 02/23/2011, 11/23/2011   Influenza, High Dose Seasonal PF 11/03/2017   Influenza,inj,Quad PF,6+ Mos 11/08/2013, 10/24/2014, 10/15/2016   Influenza-Unspecified 11/08/2013   Moderna Sars-Covid-2 Vaccination 03/24/2019, 04/21/2019   PFIZER(Purple Top)SARS-COV-2 Vaccination 12/26/2019   Pneumococcal Conjugate-13 05/10/2017, 11/03/2017   Pneumococcal Polysaccharide-23 07/06/2019   Zoster, Live 03/20/2015    Health Maintenance  Topic Date Due   Zoster Vaccines- Shingrix (1 of 2) Never done   COVID-19 Vaccine (4 - 2023-24 season) 09/11/2021   MAMMOGRAM  07/28/2022   Fecal DNA (Cologuard)  08/07/2022   INFLUENZA VACCINE  08/12/2022   Medicare Annual Wellness (AWV)  05/10/2023  DEXA SCAN  09/18/2026   Pneumonia Vaccine 66+ Years old  Completed   Hepatitis C Screening  Completed   HPV VACCINES  Aged Out   DTaP/Tdap/Td  Discontinued   Colonoscopy  Discontinued   Discussed shingles vaccines, pt reports had the old one no interest in updating.  Discussed health benefits of physical activity, and encouraged her to engage in  regular exercise appropriate for her age and condition.  Problem List Items Addressed This Visit       Cardiovascular and Mediastinum   Paroxysmal atrial fibrillation (HCC)    Irregular today Recent heart monitor use w/ cardio  Controlled rate on eliquis      Hypertension    Well controlled Cont meds Ordered cmp F/u 6 mo      Relevant Orders   CBC with Differential/Platelet   Comprehensive metabolic panel     Other   HLD (hyperlipidemia)    Managing with atorvastatin 40 mg. Will check fasting lipids      Relevant Orders   Lipid panel   Avitaminosis D   Relevant Orders   Vitamin D (25 hydroxy)   Other Visit Diagnoses     Annual physical exam    -  Primary   Relevant Orders   CBC with Differential/Platelet   Comprehensive metabolic panel   Lipid panel   HgB A1c   Vitamin D (25 hydroxy)   Hyperglycemia       Relevant Orders   HgB A1c   Colon cancer screening       Relevant Orders   Cologuard   Atopic dermatitis, unspecified type       Eustachian tube dysfunction, left       Chronic pain of both knees          8. Atopic dermatitis, unspecified type Topical hydrocortisone to resolve irritation from heart monitor  9. Eustachian tube dysfunction, left -Try over-the-counter Zyrtec as needed for possible allergy-related symptoms.   bilateral knee Pain: Discussed the use of cortisone shots for inflammation due to arthritis. -Advised she see ortho for possible cortisone injection. explained benefits and when to use  -Discussed shingles vaccination. Patient has had the older version of the vaccine. Discussed the benefits of the newer version       Return in about 6 months (around 01/12/2023) for chronic conditions.     I, Alfredia Ferguson, PA-C have reviewed all documentation for this visit. The documentation on  07/12/22   for the exam, diagnosis, procedures, and orders are all accurate and complete.  Alfredia Ferguson, PA-C [facility] [address] [place], [place], [address] Office: [phone] Fax: [phone]   [facility]

## 2022-07-12 NOTE — Assessment & Plan Note (Signed)
Well controlled Cont meds Ordered cmp F/u 6 mo

## 2022-07-12 NOTE — Assessment & Plan Note (Signed)
Irregular today Recent heart monitor use w/ cardio  Controlled rate on eliquis

## 2022-07-12 NOTE — Assessment & Plan Note (Signed)
Managing with atorvastatin 40 mg. Will check fasting lipids 

## 2022-07-13 LAB — COMPREHENSIVE METABOLIC PANEL
ALT: 15 IU/L (ref 0–32)
AST: 23 IU/L (ref 0–40)
Albumin: 4 g/dL (ref 3.9–4.9)
Alkaline Phosphatase: 76 IU/L (ref 44–121)
BUN/Creatinine Ratio: 18 (ref 12–28)
BUN: 14 mg/dL (ref 8–27)
Bilirubin Total: 0.8 mg/dL (ref 0.0–1.2)
CO2: 25 mmol/L (ref 20–29)
Calcium: 10.2 mg/dL (ref 8.7–10.3)
Chloride: 106 mmol/L (ref 96–106)
Creatinine, Ser: 0.8 mg/dL (ref 0.57–1.00)
Globulin, Total: 2.7 g/dL (ref 1.5–4.5)
Glucose: 116 mg/dL — ABNORMAL HIGH (ref 70–99)
Potassium: 4.4 mmol/L (ref 3.5–5.2)
Sodium: 143 mmol/L (ref 134–144)
Total Protein: 6.7 g/dL (ref 6.0–8.5)
eGFR: 79 mL/min/{1.73_m2} (ref >59)

## 2022-07-13 LAB — CBC WITH DIFFERENTIAL/PLATELET
Basophils Absolute: 0 10*3/uL (ref 0.0–0.2)
Basos: 1 %
EOS (ABSOLUTE): 0.1 10*3/uL (ref 0.0–0.4)
Eos: 3 %
Hematocrit: 37.9 % (ref 34.0–46.6)
Hemoglobin: 12 g/dL (ref 11.1–15.9)
Immature Grans (Abs): 0 10*3/uL (ref 0.0–0.1)
Immature Granulocytes: 0 %
Lymphocytes Absolute: 1.5 10*3/uL (ref 0.7–3.1)
Lymphs: 30 %
MCH: 32 pg (ref 26.6–33.0)
MCHC: 31.7 g/dL (ref 31.5–35.7)
MCV: 101 fL — ABNORMAL HIGH (ref 79–97)
Monocytes Absolute: 0.5 10*3/uL (ref 0.1–0.9)
Monocytes: 10 %
Neutrophils Absolute: 3 10*3/uL (ref 1.4–7.0)
Neutrophils: 56 %
Platelets: 183 10*3/uL (ref 150–450)
RBC: 3.75 x10E6/uL — ABNORMAL LOW (ref 3.77–5.28)
RDW: 12 % (ref 11.7–15.4)
WBC: 5.2 10*3/uL (ref 3.4–10.8)

## 2022-07-13 LAB — VITAMIN D 25 HYDROXY (VIT D DEFICIENCY, FRACTURES): Vit D, 25-Hydroxy: 29.7 ng/mL — ABNORMAL LOW (ref 30.0–100.0)

## 2022-07-13 LAB — LIPID PANEL
Chol/HDL Ratio: 2.8 ratio (ref 0.0–4.4)
Cholesterol, Total: 144 mg/dL (ref 100–199)
HDL: 52 mg/dL (ref >39)
LDL Chol Calc (NIH): 73 mg/dL (ref 0–99)
Triglycerides: 107 mg/dL (ref 0–149)
VLDL Cholesterol Cal: 19 mg/dL (ref 5–40)

## 2022-07-13 LAB — HEMOGLOBIN A1C
Est. average glucose Bld gHb Est-mCnc: 97 mg/dL
Hgb A1c MFr Bld: 5 % (ref 4.8–5.6)

## 2022-07-29 ENCOUNTER — Ambulatory Visit
Admission: RE | Admit: 2022-07-29 | Discharge: 2022-07-29 | Disposition: A | Discharge status: Home / Self Care | Payer: PPO | Source: Ambulatory Visit | Attending: Physician Assistant | Admitting: Physician Assistant

## 2022-07-29 DIAGNOSIS — Z1231 Encounter for screening mammogram for malignant neoplasm of breast: Secondary | ICD-10-CM

## 2022-07-30 DIAGNOSIS — M170 Bilateral primary osteoarthritis of knee: Secondary | ICD-10-CM | POA: Diagnosis not present

## 2022-08-02 ENCOUNTER — Other Ambulatory Visit: Payer: Self-pay | Admitting: Physician Assistant

## 2022-08-02 DIAGNOSIS — R928 Other abnormal and inconclusive findings on diagnostic imaging of breast: Secondary | ICD-10-CM

## 2022-08-04 DIAGNOSIS — I480 Paroxysmal atrial fibrillation: Secondary | ICD-10-CM | POA: Diagnosis not present

## 2022-08-09 ENCOUNTER — Ambulatory Visit: Admission: RE | Admit: 2022-08-09 | Payer: PPO | Source: Ambulatory Visit

## 2022-08-09 ENCOUNTER — Ambulatory Visit
Admission: RE | Admit: 2022-08-09 | Discharge: 2022-08-09 | Disposition: A | Discharge status: Home / Self Care | Payer: PPO | Source: Ambulatory Visit | Attending: Physician Assistant | Admitting: Physician Assistant

## 2022-08-09 DIAGNOSIS — R928 Other abnormal and inconclusive findings on diagnostic imaging of breast: Secondary | ICD-10-CM | POA: Diagnosis not present

## 2022-08-09 DIAGNOSIS — R92321 Mammographic fibroglandular density, right breast: Secondary | ICD-10-CM | POA: Diagnosis not present

## 2022-08-10 DIAGNOSIS — Z8673 Personal history of transient ischemic attack (TIA), and cerebral infarction without residual deficits: Secondary | ICD-10-CM | POA: Diagnosis not present

## 2022-08-10 DIAGNOSIS — E7800 Pure hypercholesterolemia, unspecified: Secondary | ICD-10-CM | POA: Diagnosis not present

## 2022-08-10 DIAGNOSIS — I10 Essential (primary) hypertension: Secondary | ICD-10-CM | POA: Diagnosis not present

## 2022-08-10 DIAGNOSIS — I4811 Longstanding persistent atrial fibrillation: Secondary | ICD-10-CM | POA: Diagnosis not present

## 2022-08-11 DIAGNOSIS — M170 Bilateral primary osteoarthritis of knee: Secondary | ICD-10-CM | POA: Diagnosis not present

## 2022-08-23 DIAGNOSIS — Z1211 Encounter for screening for malignant neoplasm of colon: Secondary | ICD-10-CM | POA: Diagnosis not present

## 2022-08-29 LAB — COLOGUARD: COLOGUARD: NEGATIVE

## 2022-12-02 DIAGNOSIS — Z8673 Personal history of transient ischemic attack (TIA), and cerebral infarction without residual deficits: Secondary | ICD-10-CM | POA: Diagnosis not present

## 2022-12-02 DIAGNOSIS — I4811 Longstanding persistent atrial fibrillation: Secondary | ICD-10-CM | POA: Diagnosis not present

## 2022-12-02 DIAGNOSIS — I10 Essential (primary) hypertension: Secondary | ICD-10-CM | POA: Diagnosis not present

## 2022-12-02 DIAGNOSIS — E7800 Pure hypercholesterolemia, unspecified: Secondary | ICD-10-CM | POA: Diagnosis not present

## 2023-01-18 ENCOUNTER — Ambulatory Visit (INDEPENDENT_AMBULATORY_CARE_PROVIDER_SITE_OTHER): Payer: PPO | Admitting: Family Medicine

## 2023-01-18 VITALS — BP 136/87 | HR 95 | Temp 97.4°F | Resp 18 | Ht 62.0 in | Wt 285.0 lb

## 2023-01-18 DIAGNOSIS — G8929 Other chronic pain: Secondary | ICD-10-CM

## 2023-01-18 DIAGNOSIS — R739 Hyperglycemia, unspecified: Secondary | ICD-10-CM | POA: Insufficient documentation

## 2023-01-18 DIAGNOSIS — I4811 Longstanding persistent atrial fibrillation: Secondary | ICD-10-CM

## 2023-01-18 DIAGNOSIS — I10 Essential (primary) hypertension: Secondary | ICD-10-CM

## 2023-01-18 DIAGNOSIS — E559 Vitamin D deficiency, unspecified: Secondary | ICD-10-CM

## 2023-01-18 DIAGNOSIS — M25562 Pain in left knee: Secondary | ICD-10-CM

## 2023-01-18 DIAGNOSIS — Z9884 Bariatric surgery status: Secondary | ICD-10-CM | POA: Diagnosis not present

## 2023-01-18 DIAGNOSIS — E782 Mixed hyperlipidemia: Secondary | ICD-10-CM

## 2023-01-18 DIAGNOSIS — D649 Anemia, unspecified: Secondary | ICD-10-CM

## 2023-01-18 DIAGNOSIS — D7589 Other specified diseases of blood and blood-forming organs: Secondary | ICD-10-CM | POA: Diagnosis not present

## 2023-01-18 DIAGNOSIS — I480 Paroxysmal atrial fibrillation: Secondary | ICD-10-CM

## 2023-01-18 DIAGNOSIS — M25561 Pain in right knee: Secondary | ICD-10-CM

## 2023-01-18 NOTE — Progress Notes (Signed)
 Established patient visit   Patient: IZZIE [name]   DOB: [birth year]   73 y.o. Female  MRN: [id number] Visit Date: 01/18/2023  Today's healthcare provider: LAURAINE LOISE BUOY, DO   Chief Complaint  Patient presents with   Atrial Fibrillation   Subjective    Atrial Fibrillation Symptoms include dizziness (intermittently). Symptoms are negative for chest pain, palpitations, shortness of breath and weakness. Past medical history includes atrial fibrillation.   Last Annual exam: 07/12/2022 Rate control a-fib on eliquis; follow up w/cards   The patient has a history of atrial fibrillation and is currently on Eliquis. She has regular follow-ups with her cardiologist every three to four months. The patient has received both the flu and COVID vaccines but has not received the RSV or the newer shingles vaccine. She has a history of vertical-banded gastroplasty and has been taking over-the-counter vitamin D  supplements and a multivitamin with iron due to a past diagnosis of anemia.  The patient has a history of migraines and used to take Imitrex , but, due to cost, she switched to Excedrin Migraine several years ago. She also used to take Meloxicam  but stopped due to potential interactions with Eliquis. She reports occasional dizziness, which started after initiating Eliquis.  The patient has knee pain, for which she has received cortisone shots in the past. She occasionally used a handicap parking permit, particularly when she anticipates a lot of walking.  However, she does need a new DMV form filled out for one.  She reports occasional use of a marijuana cream for pain relief, but notes that it provides very temporary relief.     Medications: Outpatient Medications Prior to Visit  Medication Sig   apixaban (ELIQUIS) 5 MG TABS tablet Take 5 mg by mouth 2 (two) times daily.   aspirin-acetaminophen -caffeine (EXCEDRIN MIGRAINE) 250-250-65 MG tablet Take by mouth as needed for headache.    atorvastatin  (LIPITOR) 40 MG tablet TAKE 1 TABLET BY MOUTH EVERY DAY   b complex vitamins capsule Take 1 capsule by mouth daily.    Calcium  Carbonate (CALCIUM  600 PO) Take by mouth daily.   cholecalciferol (VITAMIN D ) 1000 UNITS tablet Take 1,000 Units by mouth daily.    diltiazem (CARDIZEM CD) 120 MG 24 hr capsule Take by mouth.   MULTIPLE VITAMIN PO Take by mouth daily.   Omega-3 Fatty Acids (FISH OIL) 1000 MG CAPS Take by mouth daily.    [DISCONTINUED] meloxicam  (MOBIC ) 15 MG tablet TAKE 1 TABLET (15 MG TOTAL) BY MOUTH DAILY.   No facility-administered medications prior to visit.    Review of Systems  Constitutional:  Negative for appetite change, chills, fatigue and fever.  Respiratory:  Negative for chest tightness and shortness of breath.   Cardiovascular:  Negative for chest pain and palpitations.  Gastrointestinal:  Negative for abdominal pain, nausea and vomiting.  Musculoskeletal:  Positive for arthralgias (knee pain).  Neurological:  Positive for dizziness (intermittently) and headaches (takes excedrin). Negative for weakness.        Objective    BP 136/87   Pulse 95   Temp (!) 97.4 F (36.3 C) (Oral)   Resp 18   Ht 5' 2 (1.575 m)   Wt 285 lb (129.3 kg)   SpO2 99%   BMI 52.13 kg/m     Physical Exam Constitutional:      Appearance: Normal appearance.  HENT:     Head: Normocephalic and atraumatic.  Eyes:     General: No scleral icterus.  Extraocular Movements: Extraocular movements intact.     Conjunctiva/sclera: Conjunctivae normal.  Cardiovascular:     Rate and Rhythm: Normal rate. Rhythm irregular.     Pulses: Normal pulses.     Heart sounds: Normal heart sounds.  Pulmonary:     Effort: Pulmonary effort is normal. No respiratory distress.     Breath sounds: Normal breath sounds.  Abdominal:     General: Bowel sounds are normal. There is no distension.     Palpations: Abdomen is soft. There is no mass.     Tenderness: There is no abdominal  tenderness. There is no guarding.  Musculoskeletal:     Right lower leg: No edema.     Left lower leg: No edema.  Skin:    General: Skin is warm and dry.  Neurological:     Mental Status: She is alert and oriented to person, place, and time. Mental status is at baseline.  Psychiatric:        Mood and Affect: Mood normal.        Behavior: Behavior normal.      Results for orders placed or performed in visit on 01/18/23  Comprehensive metabolic panel  Result Value Ref Range   Glucose 111 (H) 70 - 99 mg/dL   BUN 17 8 - 27 mg/dL   Creatinine, Ser 9.18 0.57 - 1.00 mg/dL   eGFR 78 >40 fO/fpw/8.26   BUN/Creatinine Ratio 21 12 - 28   Sodium 144 134 - 144 mmol/L   Potassium 4.1 3.5 - 5.2 mmol/L   Chloride 105 96 - 106 mmol/L   CO2 23 20 - 29 mmol/L   Calcium  10.4 (H) 8.7 - 10.3 mg/dL   Total Protein 6.5 6.0 - 8.5 g/dL   Albumin 4.3 3.9 - 4.9 g/dL   Globulin, Total 2.2 1.5 - 4.5 g/dL   Bilirubin Total 0.9 0.0 - 1.2 mg/dL   Alkaline Phosphatase 84 44 - 121 IU/L   AST 25 0 - 40 IU/L   ALT 14 0 - 32 IU/L  Hemoglobin A1c  Result Value Ref Range   Hgb A1c MFr Bld 5.1 4.8 - 5.6 %   Est. average glucose Bld gHb Est-mCnc 100 mg/dL  Lipid panel  Result Value Ref Range   Cholesterol, Total 141 100 - 199 mg/dL   Triglycerides 90 0 - 149 mg/dL   HDL 56 >60 mg/dL   VLDL Cholesterol Cal 17 5 - 40 mg/dL   LDL Chol Calc (NIH) 68 0 - 99 mg/dL   Chol/HDL Ratio 2.5 0.0 - 4.4 ratio  VITAMIN D  25 Hydroxy (Vit-D Deficiency, Fractures)  Result Value Ref Range   Vit D, 25-Hydroxy 42.3 30.0 - 100.0 ng/mL  CBC  Result Value Ref Range   WBC 5.9 3.4 - 10.8 x10E3/uL   RBC 3.97 3.77 - 5.28 x10E6/uL   Hemoglobin 12.9 11.1 - 15.9 g/dL   Hematocrit 59.1 65.9 - 46.6 %   MCV 103 (H) 79 - 97 fL   MCH 32.5 26.6 - 33.0 pg   MCHC 31.6 31.5 - 35.7 g/dL   RDW 88.5 (L) 88.2 - 84.5 %   Platelets 195 150 - 450 x10E3/uL  B12 and Folate Panel  Result Value Ref Range   Vitamin B-12 761 232 - 1,245 pg/mL    Folate >20.0 >3.0 ng/mL    Assessment & Plan    Longstanding persistent atrial fibrillation (HCC) Assessment & Plan: Chronic atrial fibrillation, managed with Eliquis. Heart rate typically around 95 bpm, no significant  symptoms. Cardiologist follow-up every 3-4 months. Discussed risks and benefits of Eliquis, including stroke prevention and minor bleeding.  - Continue Eliquis - Monitor heart rate and symptoms - Follow up with cardiologist as scheduled   Obesity, morbid, BMI 50 or higher (HCC) Assessment & Plan: Noted.  Not addressed during today's visit.  Will address at follow-up.   Bariatric surgery status Assessment & Plan: Noted. Addressed as noted below.   Vitamin D  deficiency Assessment & Plan: Low vitamin D  levels due to vertical banded gastroplasty. Currently taking 1000 IU of vitamin D  daily. Discussed importance of maintaining adequate vitamin D  levels to prevent bone-related complications. - Recheck vitamin D  levels with upcoming labs - Continue current vitamin D  supplementation  Orders: -     VITAMIN D  25 Hydroxy (Vit-D Deficiency, Fractures)  Macrocytosis Assessment & Plan: Will evaluate for vitamin B12 and folate deficiencies; if low, will advise patient to start supplementation.  Orders: -     B12 and Folate Panel  Hyperglycemia Assessment & Plan: Slightly elevated blood sugar in July labs. Patient was fasting.  - Order A1c to assess for prediabetes or diabetes  Orders: -     Hemoglobin A1c  Anemia, unspecified type Assessment & Plan: Hx anemia, most recent HGB WNL. Will recheck today.  Orders: -     CBC  Mixed hyperlipidemia Assessment & Plan: Managed on atorvastatin  40 mg daily. Will check lipid panel today. Will continue atorvastatin  40 mg daily  Orders: -     Lipid panel  Primary hypertension Assessment & Plan: Fairly stable today.  Not on medication specific to blood pressure.  However, patient is taking diltiazem 120 mg daily, which  likely will have caused her blood pressure to be lower and otherwise would be. No acute concerns.  Continue to monitor.  Orders: -     Comprehensive metabolic panel  Chronic pain of both knees Assessment & Plan: Chronic knee pain, previously managed with meloxicam , discontinued due to Eliquis interaction. Occasional use of Aleve and Tylenol , though Tylenol  is ineffective. Cortisone injections used in the past. Discussed Voltaren gel and capsaicin cream. Patient prefers to avoid oral NSAIDs due to potential kidney damage and Eliquis interaction. - Consider Voltaren gel for localized pain relief - Consider capsaicin cream for pain management - Continue occasional use of Aleve and Tylenol  as needed   General Health Maintenance Received flu and COVID vaccines but not updated shingles vaccine. Discussed benefits of new shingles vaccine and logistics of obtaining it. Patient concerned about inconvenience of getting the vaccine at the pharmacy. - Recommend updated shingles vaccine at the pharmacy - Document flu and COVID vaccinations in the medical record - Order metabolic panel to assess kidney function, liver function, and electrolytes - Order CBC to check for anemia - Order B12 and folate levels due to history of macrocytosis   Return in about 6 months (around 07/18/2023) for CPE.      I discussed the assessment and treatment plan with the patient  The patient was provided an opportunity to ask questions and all were answered. The patient agreed with the plan and demonstrated an understanding of the instructions.   The patient was advised to call back or seek an in-person evaluation if the symptoms worsen or if the condition fails to improve as anticipated.    LAURAINE LOISE BUOY, DO  [facility] [phone] (phone) [phone] (fax)  [facility]

## 2023-01-19 ENCOUNTER — Encounter: Payer: Self-pay | Admitting: Family Medicine

## 2023-01-19 DIAGNOSIS — G8929 Other chronic pain: Secondary | ICD-10-CM | POA: Insufficient documentation

## 2023-01-19 LAB — B12 AND FOLATE PANEL
Folate: >20 ng/mL (ref >3.0)
Vitamin B-12: 761 pg/mL (ref 232–1245)

## 2023-01-19 LAB — COMPREHENSIVE METABOLIC PANEL
ALT: 14 [IU]/L (ref 0–32)
AST: 25 [IU]/L (ref 0–40)
Albumin: 4.3 g/dL (ref 3.9–4.9)
Alkaline Phosphatase: 84 [IU]/L (ref 44–121)
BUN/Creatinine Ratio: 21 (ref 12–28)
BUN: 17 mg/dL (ref 8–27)
Bilirubin Total: 0.9 mg/dL (ref 0.0–1.2)
CO2: 23 mmol/L (ref 20–29)
Calcium: 10.4 mg/dL — ABNORMAL HIGH (ref 8.7–10.3)
Chloride: 105 mmol/L (ref 96–106)
Creatinine, Ser: 0.81 mg/dL (ref 0.57–1.00)
Globulin, Total: 2.2 g/dL (ref 1.5–4.5)
Glucose: 111 mg/dL — ABNORMAL HIGH (ref 70–99)
Potassium: 4.1 mmol/L (ref 3.5–5.2)
Sodium: 144 mmol/L (ref 134–144)
Total Protein: 6.5 g/dL (ref 6.0–8.5)
eGFR: 78 mL/min/{1.73_m2} (ref >59)

## 2023-01-19 LAB — CBC
Hematocrit: 40.8 % (ref 34.0–46.6)
Hemoglobin: 12.9 g/dL (ref 11.1–15.9)
MCH: 32.5 pg (ref 26.6–33.0)
MCHC: 31.6 g/dL (ref 31.5–35.7)
MCV: 103 fL — ABNORMAL HIGH (ref 79–97)
Platelets: 195 10*3/uL (ref 150–450)
RBC: 3.97 x10E6/uL (ref 3.77–5.28)
RDW: 11.4 % — ABNORMAL LOW (ref 11.7–15.4)
WBC: 5.9 10*3/uL (ref 3.4–10.8)

## 2023-01-19 LAB — LIPID PANEL
Chol/HDL Ratio: 2.5 {ratio} (ref 0.0–4.4)
Cholesterol, Total: 141 mg/dL (ref 100–199)
HDL: 56 mg/dL (ref >39)
LDL Chol Calc (NIH): 68 mg/dL (ref 0–99)
Triglycerides: 90 mg/dL (ref 0–149)
VLDL Cholesterol Cal: 17 mg/dL (ref 5–40)

## 2023-01-19 LAB — VITAMIN D 25 HYDROXY (VIT D DEFICIENCY, FRACTURES): Vit D, 25-Hydroxy: 42.3 ng/mL (ref 30.0–100.0)

## 2023-01-19 LAB — HEMOGLOBIN A1C
Est. average glucose Bld gHb Est-mCnc: 100 mg/dL
Hgb A1c MFr Bld: 5.1 % (ref 4.8–5.6)

## 2023-01-19 NOTE — Assessment & Plan Note (Signed)
 Chronic atrial fibrillation, managed with Eliquis. Heart rate typically around 95 bpm, no significant symptoms. Cardiologist follow-up every 3-4 months. Discussed risks and benefits of Eliquis, including stroke prevention and minor bleeding.  - Continue Eliquis - Monitor heart rate and symptoms - Follow up with cardiologist as scheduled

## 2023-01-19 NOTE — Assessment & Plan Note (Signed)
 Managed on atorvastatin 40 mg daily. Will check lipid panel today. Will continue atorvastatin 40 mg daily

## 2023-01-19 NOTE — Assessment & Plan Note (Signed)
 Noted. Addressed as noted below.

## 2023-01-19 NOTE — Assessment & Plan Note (Signed)
 Noted.  Not addressed during today's visit.  Will address at follow-up.

## 2023-01-19 NOTE — Assessment & Plan Note (Signed)
 Fairly stable today.  Not on medication specific to blood pressure.  However, patient is taking diltiazem 120 mg daily, which likely will have caused her blood pressure to be lower and otherwise would be. No acute concerns.  Continue to monitor.

## 2023-01-19 NOTE — Assessment & Plan Note (Signed)
 Chronic knee pain, previously managed with meloxicam , discontinued due to Eliquis interaction. Occasional use of Aleve and Tylenol , though Tylenol  is ineffective. Cortisone injections used in the past. Discussed Voltaren gel and capsaicin cream. Patient prefers to avoid oral NSAIDs due to potential kidney damage and Eliquis interaction. - Consider Voltaren gel for localized pain relief - Consider capsaicin cream for pain management - Continue occasional use of Aleve and Tylenol  as needed

## 2023-01-19 NOTE — Assessment & Plan Note (Signed)
 Slightly elevated blood sugar in July labs. Patient was fasting.  - Order A1c to assess for prediabetes or diabetes

## 2023-01-19 NOTE — Assessment & Plan Note (Signed)
 Hx anemia, most recent HGB WNL. Will recheck today.

## 2023-01-19 NOTE — Assessment & Plan Note (Signed)
 Low vitamin D  levels due to vertical banded gastroplasty. Currently taking 1000 IU of vitamin D  daily. Discussed importance of maintaining adequate vitamin D  levels to prevent bone-related complications. - Recheck vitamin D  levels with upcoming labs - Continue current vitamin D  supplementation

## 2023-01-19 NOTE — Assessment & Plan Note (Signed)
 Will evaluate for vitamin B12 and folate deficiencies; if low, will advise patient to start supplementation.

## 2023-02-10 ENCOUNTER — Ambulatory Visit: Payer: Self-pay | Admitting: *Deleted

## 2023-02-10 NOTE — Telephone Encounter (Signed)
While I have you on the phone I wanted to mention I fell on my left side and flat on my face on Thur.  My left breast was black.   It's looking better now.   No lumps or anything like that going on.  I'm continuing to monitor it.   She checked it while on the phone with me and stated,   "It's looking much better and is lighter in color today".    I'm on Eliquis twice a day for A. Fib.   I let her know if she developed lump/s or her breast began to swell or get sore to call us back to be evaluated.   "It's getting better but I just wanted to mention it to you".     She is sore from the fall but did not mention any other injuries.  "I've been taking care of my husband who has been sick with this diarrhea too".    "Now I have it".   "He went to work today but is still having some diarrhea".   "I'll have him stop at the store on the way home and get some Imodium AD".    "Thank you so much for calling me back".   Chief Complaint: uncontrollable diarrhea.   "I got it from my husband".   "He has been sick with it too and I've been taking care of him".   Symptoms: Incontinent of stool a few times.   She has not taken anything for the diarrhea.   She is drinking water and a little Gator Aid.    "I don't like the taste of Gator Aid but I'm making myself drink some of it".    Having watery diarrhea.   Not been on antibiotics in the last 2 mo.  Frequency: Her diarrhea started Tues. Pertinent Negatives: Patient denies vomiting now.   In the beginning she was vomiting but that has resolved.   No abd pain but my gut in rumbling a lot before I have to go. Disposition: [] ED /[] Urgent Care (no appt availability in office) / [] Appointment(In office/virtual)/ []  [place] [facility]/ [x] Home Care/ [] Refused Recommended Disposition /[]  Mobile Bus/ []  Follow-up with PCP Additional Notes: I went over the home care advice with her.    Her husband is going to stop on the way home from work and pick up some Imodium AD  for the both of them.   I let her know to call back if the diarrhea does not resolve in a day or two or she starts having symptoms of dehydration which I went over with her in the care advice.    Also went over precautions to call us back for her left breast injury.   See note above.   Pt. Verbalized understanding and agreeable to the plan of care.

## 2023-02-10 NOTE — Telephone Encounter (Signed)
Message from Eureka B sent at 02/10/2023  9:07 AM EST  Summary: DIARRHEA - uncontrolled   Patient experiencing [name] stomach bug since Tuesday. Patient states has stopped, and experiencing uncontrollable  diarrhea. Caller states she feels weak. Caller states when [name] nurse calls her back if you do not get her off the first try please wait 5 minutes and try again.          Call History  Contact Date/Time Type Contact Phone/Fax By  02/10/2023 09:05 AM EST Phone (Incoming) Karen [name], Karen [name] (Self) [phone] Harold Barban   Reason for Disposition  SEVERE diarrhea (e.g., 7 or more times / day more than normal)  Answer Assessment - Initial Assessment Questions 1. DIARRHEA SEVERITY: "How bad is the diarrhea?" "How many more stools have you had in the past 24 hours than normal?"    - NO DIARRHEA (SCALE 0)   - MILD (SCALE 1-3): Few loose or mushy BMs; increase of 1-3 stools over normal daily number of stools; mild increase in ostomy output.   -  MODERATE (SCALE 4-7): Increase of 4-6 stools daily over normal; moderate increase in ostomy output.   -  SEVERE (SCALE 8-10; OR "WORST POSSIBLE"): Increase of 7 or more stools daily over normal; moderate increase in ostomy output; incontinence.     Having uncontrollable diarrhea.   My husband has been sick with it.   Tues. I got it.   Yesterday and today it's bad. 2. ONSET: "When did the diarrhea begin?"      Tues. During the night.   I was exposed to my husband.    She has not taken anything for the diarrhea.    I'm going to have my husband pick up some diarrhea medicine on the way home from work today.  3. BM CONSISTENCY: "How loose or watery is the diarrhea?"      Watery  regular brown color.  I've been incontinent of stool [name] few times. 4. VOMITING: "Are you also vomiting?" If Yes, ask: "How many times in the past 24 hours?"      No it stopped 5. ABDOMEN PAIN: "Are you having any abdomen pain?" If Yes, ask: "What does it feel like?" (e.g., crampy,  dull, intermittent, constant)      It rumbles and then I have to go 6. ABDOMEN PAIN SEVERITY: If present, ask: "How bad is the pain?"  (e.g., Scale 1-10; mild, moderate, or severe)   - MILD (1-3): doesn't interfere with normal activities, abdomen soft and not tender to touch    - MODERATE (4-7): interferes with normal activities or awakens from sleep, abdomen tender to touch    - SEVERE (8-10): excruciating pain, doubled over, unable to do any normal activities       No pain 7. ORAL INTAKE: If vomiting, "Have you been able to drink liquids?" "How much liquids have you had in the past 24 hours?"     Keep drinking the gator ade 8. HYDRATION: "Any signs of dehydration?" (e.g., dry mouth [not just dry lips], too weak to stand, dizziness, new weight loss) "When did you last urinate?"     No 9. EXPOSURE: "Have you traveled to [name] foreign country recently?" "Have you been exposed to anyone with diarrhea?" "Could you have eaten any food that was spoiled?"     My husband was sick with this. 10. ANTIBIOTIC USE: "Are you taking antibiotics now or have you taken antibiotics in the past 2 months?"  No 11. OTHER SYMPTOMS: "Do you have any other symptoms?" (e.g., fever, blood in stool)       Tired.   12. PREGNANCY: "Is there any chance you are pregnant?" "When was your last menstrual period?"       N/[name] due to age  Protocols used: [facility]

## 2023-04-06 DIAGNOSIS — H5201 Hypermetropia, right eye: Secondary | ICD-10-CM | POA: Diagnosis not present

## 2023-04-06 DIAGNOSIS — H269 Unspecified cataract: Secondary | ICD-10-CM | POA: Diagnosis not present

## 2023-04-06 DIAGNOSIS — H524 Presbyopia: Secondary | ICD-10-CM | POA: Diagnosis not present

## 2023-04-06 DIAGNOSIS — H5212 Myopia, left eye: Secondary | ICD-10-CM | POA: Diagnosis not present

## 2023-05-11 ENCOUNTER — Ambulatory Visit: Payer: Self-pay

## 2023-05-11 DIAGNOSIS — Z0000 Encounter for general adult medical examination without abnormal findings: Secondary | ICD-10-CM

## 2023-05-11 DIAGNOSIS — Z1231 Encounter for screening mammogram for malignant neoplasm of breast: Secondary | ICD-10-CM

## 2023-05-11 NOTE — Patient Instructions (Addendum)
 Ms. Jackowski , Thank you for taking time to come for your Medicare Wellness Visit. I appreciate your ongoing commitment to your health goals. Please review the following plan we discussed and let me know if I can assist you in the future.   Referrals/Orders/Follow-Ups/Clinician Recommendations: MAMMOGRAM ORDERED  You have an order for:  []   2D Mammogram  [x]   3D Mammogram  []   Bone Density     Please call for appointment:  [facility]  [address] [place] [address] [phone] [facility] [address] [place], [place] [address] [phone] [facility] [address] Tracey Friday [place], [place] [address] [phone]   Make sure to wear two-piece clothing.  No lotions, powders, or deodorants the day of the appointment. Make sure to bring picture ID and insurance card.  Bring list of medications you are currently taking including any supplements.   Schedule your [place] screening mammogram through MyChart!   Log into your MyChart account.  Go to 'Visit' (or 'Appointments' if on mobile App) --> Schedule an Appointment  Under 'Select a Reason for Visit' choose the Mammogram Screening option.  Complete the pre-visit questions and select the time and place that best fits your schedule.   This is a list of the screening recommended for you and due dates:  Health Maintenance  Topic Date Due   Zoster (Shingles) Vaccine (1 of 2) 06/20/1969   COVID-19 Vaccine (5 - 2024-25 season) 09/12/2023*   Mammogram  07/29/2023   Flu Shot  08/12/2023   Medicare Annual Wellness Visit  05/10/2024   Cologuard (Stool DNA test)  08/22/2025   DEXA scan (bone density measurement)  09/18/2026   Pneumonia Vaccine  Completed   Hepatitis C Screening  Completed   HPV Vaccine  Aged Out   Meningitis B Vaccine  Aged Out   DTaP/Tdap/Td vaccine  Discontinued   Colon Cancer  Screening  Discontinued  *Topic was postponed. The date shown is not the original due date.    Advanced directives: (ACP Link)Information on Advanced Care Planning can be found at [place]  Secretary of [facility] Advance Health Care Directives Advance Health Care Directives. [email or web address]   Next Medicare Annual Wellness Visit scheduled for next year: Yes  05/16/24 @ 10:50 AM BY PHONE

## 2023-05-11 NOTE — Progress Notes (Signed)
 Subjective:   Karen [name] is a 73 y.o. who presents for a Medicare Wellness preventive visit.  Visit Complete: Virtual I connected with  Karen [name] on 05/11/23 by a audio enabled telemedicine application and verified that I am speaking with the correct person using two identifiers.  Patient Location: Home  Provider Location: Home Office  I discussed the limitations of evaluation and management by telemedicine. The patient expressed understanding and agreed to proceed.  Vital Signs: Because this visit was a virtual/telehealth visit, some criteria may be missing or patient reported. Any vitals not documented were not able to be obtained and vitals that have been documented are patient reported.  VideoDeclined- This patient declined Librarian, academic. Therefore the visit was completed with audio only.  Persons Participating in Visit: Patient.  AWV Questionnaire: No: Patient Medicare AWV questionnaire was not completed prior to this visit.  Cardiac Risk Factors include: advanced age (>60men, >40 women);dyslipidemia;hypertension;obesity (BMI >30kg/m2)     Objective:    Today's Vitals   05/11/23 0935  PainSc: 3    There is no height or weight on file to calculate BMI.     05/11/2023    9:40 AM 05/10/2022    9:59 AM 05/05/2021   10:32 AM 04/10/2020    2:45 PM 10/17/2019   10:14 AM 04/05/2019    2:12 PM 02/05/2016    5:45 PM  Advanced Directives  Does Patient Have a Medical Advance Directive? No No No No No No No  Would patient like information on creating a medical advance directive? No - Patient declined  No - Patient declined No - Patient declined  No - Patient declined     Current Medications (verified) Outpatient Encounter Medications as of 05/11/2023  Medication Sig   apixaban (ELIQUIS) 5 MG TABS tablet Take 5 mg by mouth 2 (two) times daily.   aspirin-acetaminophen -caffeine (EXCEDRIN MIGRAINE) 250-250-65 MG tablet Take by mouth as needed  for headache.   atorvastatin  (LIPITOR) 40 MG tablet TAKE 1 TABLET BY MOUTH EVERY DAY   b complex vitamins capsule Take 1 capsule by mouth daily.    Calcium  Carbonate (CALCIUM  600 PO) Take by mouth daily.   cholecalciferol (VITAMIN D ) 1000 UNITS tablet Take 1,000 Units by mouth daily.    diltiazem (CARDIZEM CD) 120 MG 24 hr capsule Take by mouth.   MULTIPLE VITAMIN PO Take by mouth daily.   Omega-3 Fatty Acids (FISH OIL) 1000 MG CAPS Take by mouth daily.    No facility-administered encounter medications on file as of 05/11/2023.    Allergies (verified) Tetanus toxoid   History: Past Medical History:  Diagnosis Date   A-fib [facility])    Allergy 1990   Anemia    Arthritis 2015   Cataract 2017   Hyperlipidemia    Stroke [facility]) 1991   Vitamin D  deficiency    Past Surgical History:  Procedure Laterality Date   ABDOMINAL HYSTERECTOMY     CHOLECYSTECTOMY     CHOLECYSTECTOMY  01/11/1985   COLONOSCOPY WITH PROPOFOL  N/A 10/17/2019   Procedure: COLONOSCOPY WITH PROPOFOL ;  Surgeon: Marshall Skeeter, MD;  Location: [facility] ENDOSCOPY;  Service: Endoscopy;  Laterality: N/A;   GASTROPLASTY  01/11/1985   HERNIA REPAIR     Family History  Problem Relation Age of Onset   Hypertension Mother    Cancer Mother    Arthritis Mother    Depression Mother    Heart disease Mother    Obesity Mother    Stroke  Mother    Heart disease Father    Breast cancer Sister    Cancer Sister    Pancreatic cancer Brother    Cancer Paternal Grandfather    Hyperlipidemia Brother    Hypertension Brother    Cancer Brother    Heart disease Maternal Grandmother    Stroke Maternal Grandmother    Arthritis Maternal Grandmother    Breast cancer Maternal Aunt    Cancer Maternal Aunt    Heart disease Maternal Grandfather    Heart disease Paternal Grandmother    Arthritis Maternal Aunt    Arthritis Brother    Hearing loss Brother    Heart disease Brother    Hypertension Brother    Social History    Socioeconomic History   Marital status: Married    Spouse name: Not on file   Number of children: 0   Years of education: Not on file   Highest education level: GED or equivalent  Occupational History   Occupation: retired  Tobacco Use   Smoking status: Never   Smokeless tobacco: Never  Vaping Use   Vaping status: Never Used  Substance and Sexual Activity   Alcohol use: Not Currently    Comment: rarely Liqueur   Drug use: Never   Sexual activity: Not Currently    Birth control/protection: Post-menopausal  Other Topics Concern   Not on file  Social History Narrative   Not on file   Social Drivers of Health   Financial Resource Strain: Low Risk  (05/11/2023)   Overall Financial Resource Strain (CARDIA)    Difficulty of Paying Living Expenses: Not hard at all  Food Insecurity: No Food Insecurity (05/11/2023)   Hunger Vital Sign    Worried About Running Out of Food in the Last Year: Never true    Ran Out of Food in the Last Year: Never true  Transportation Needs: No Transportation Needs (05/11/2023)   PRAPARE - Administrator, Civil Service (Medical): No    Lack of Transportation (Non-Medical): No  Physical Activity: Insufficiently Active (05/11/2023)   Exercise Vital Sign    Days of Exercise per Week: 3 days    Minutes of Exercise per Session: 30 min  Stress: No Stress Concern Present (05/11/2023)   [employer] of Occupational Health - Occupational Stress Questionnaire    Feeling of Stress : Not at all  Social Connections: Socially Integrated (05/11/2023)   Social Connection and Isolation Panel [NHANES]    Frequency of Communication with Friends and Family: More than three times a week    Frequency of Social Gatherings with Friends and Family: Once a week    Attends Religious Services: 1 to 4 times per year    Active Member of [employer] or Organizations: Yes    Attends Banker Meetings: 1 to 4 times per year    Marital Status: Married     Tobacco Counseling Counseling given: Not Answered    Clinical Intake:  Pre-visit preparation completed: Yes  Pain : 0-10 Pain Score: 3  Pain Type: Chronic pain Pain Location: Arm Pain Orientation: Right Pain Descriptors / Indicators: Aching, Constant Pain Onset: More than a month ago Pain Frequency: Intermittent     BMI - recorded: 52.1 Nutritional Status: BMI > 30  Obese Nutritional Risks: None Diabetes: No  Lab Results  Component Value Date   HGBA1C 5.1 01/18/2023   HGBA1C 5.0 07/12/2022   HGBA1C 5.0 07/09/2021     How often do you need to have someone  help you when you read instructions, pamphlets, or other written materials from your doctor or pharmacy?: 1 - Never  Interpreter Needed?: No  Information entered by :: Dellie Fergusson, LPN   Activities of Daily Living    05/11/2023    9:41 AM 07/12/2022   10:07 AM  In your present state of health, do you have any difficulty performing the following activities:  Hearing? 0 0  Vision? 0 0  Difficulty concentrating or making decisions? 0 0  Walking or climbing stairs? 1 1  Dressing or bathing? 0 0  Doing errands, shopping? 0 0  Preparing Food and eating ? N   Using the Toilet? N   In the past six months, have you accidently leaked urine? N   Do you have problems with loss of bowel control? N   Managing your Medications? N   Managing your Finances? N   Housekeeping or managing your Housekeeping? N     Patient Care Team: Carlean Charter, DO as PCP - General (Family Medicine) Algie Ingle, OD (Optometry) Marquita Situ, Magali Schmitz, MD as Consulting Physician (General Surgery) Orlene Bjork, DDS as Referring Physician (Dentistry) Percival Brace, MD as Consulting Physician (Cardiology)  Indicate any recent Medical Services you may have received from other than [facility] providers in the past year (date may be approximate).     Assessment:   This is a routine wellness examination for Apurva.  Hearing/Vision  screen Hearing Screening - Comments:: NO AIDS Vision Screening - Comments:: [facility]- [email or web address]   Goals Addressed             This Visit's Progress    DIET - INCREASE WATER INTAKE         Depression Screen     05/11/2023    9:38 AM 01/18/2023   10:32 AM 07/12/2022   10:07 AM 05/10/2022    9:55 AM 07/09/2021    9:56 AM 05/05/2021   10:29 AM 02/12/2021    3:57 PM  PHQ 2/9 Scores  PHQ - 2 Score 0 0 0 0 0 0 0  PHQ- 9 Score 0 0   0  1    Fall Risk     05/11/2023    9:40 AM 01/18/2023   10:33 AM 07/12/2022   10:07 AM 05/06/2022    7:49 PM 07/09/2021    9:55 AM  Fall Risk   Falls in the past year? 1 0 0 0 0  Number falls in past yr: 0 0  0 0  Injury with Fall? 0 0 0 0 0  Risk for fall due to :   No Fall Risks  No Fall Risks  Follow up Falls evaluation completed;Falls prevention discussed  Falls evaluation completed      MEDICARE RISK AT HOME:  Medicare Risk at Home Any stairs in or around the home?: Yes If so, are there any without handrails?: Yes Home free of loose throw rugs in walkways, pet beds, electrical cords, etc?: Yes Adequate lighting in your home to reduce risk of falls?: Yes Life alert?: No Use of a cane, walker or w/c?: No Grab bars in the bathroom?: Yes Shower chair or bench in shower?: No Elevated toilet seat or a handicapped toilet?: No  TIMED UP AND GO:  Was the test performed?  No  Cognitive Function: 6CIT completed        05/11/2023    9:43 AM 05/10/2022   10:05 AM 04/05/2019    2:18 PM  6CIT Screen  What Year?  0 points 0 points 0 points  What month? 0 points 0 points 0 points  What time? 0 points 0 points 0 points  Count back from 20 0 points 0 points 0 points  Months in reverse 0 points 0 points 0 points  Repeat phrase 0 points 0 points 0 points  Total Score 0 points 0 points 0 points    Immunizations Immunization History  Administered Date(s) Administered   Fluad Quad(high Dose 65+) 09/28/2018, 12/26/2019, 11/05/2020, 11/05/2021    Influenza Split 11/08/2008, 02/23/2011, 11/23/2011   Influenza, High Dose Seasonal PF 11/03/2017   Influenza,inj,Quad PF,6+ Mos 11/08/2013, 10/24/2014, 10/15/2016   Influenza-Unspecified 11/08/2013   Moderna Sars-Covid-2 Vaccination 03/24/2019, 04/21/2019   PFIZER(Purple Top)SARS-COV-2 Vaccination 12/26/2019   Pfizer Covid-19 Vaccine Bivalent Booster 29yrs & up 11/05/2021   Pneumococcal Conjugate-13 05/10/2017, 11/03/2017   Pneumococcal Polysaccharide-23 07/06/2019   Zoster, Live 03/20/2015    Screening Tests Health Maintenance  Topic Date Due   Zoster Vaccines- Shingrix (1 of 2) 06[birth year]20/1971   COVID-19 Vaccine (5 - 2024-25 season) 09/12/2023 (Originally 09/12/2022)   MAMMOGRAM  07/29/2023   INFLUENZA VACCINE  08/12/2023   Medicare Annual Wellness (AWV)  05/10/2024   Fecal DNA (Cologuard)  08/22/2025   DEXA SCAN  09/18/2026   Pneumonia Vaccine 4+ Years old  Completed   Hepatitis C Screening  Completed   HPV VACCINES  Aged Out   Meningococcal B Vaccine  Aged Out   DTaP/Tdap/Td  Discontinued   Colonoscopy  Discontinued    Health Maintenance  Health Maintenance Due  Topic Date Due   Zoster Vaccines- Shingrix (1 of 2) 06[birth year]20/1969   Health Maintenance Items Addressed: UP TO DATE W/ SHOTS, UP TO DATE W/ COLOGUARD & BONE DENSITY; MAMMOGRAM ORDERED  Additional Screening:  Vision Screening: Recommended annual ophthalmology exams for early detection of glaucoma and other disorders of the eye.  Dental Screening: Recommended annual dental exams for proper oral hygiene  Community Resource Referral / Chronic Care Management: CRR required this visit?  No   CCM required this visit?  No     Plan:     I have personally reviewed and noted the following in the patient's chart:   Medical and social history Use of alcohol, tobacco or illicit drugs  Current medications and supplements including opioid prescriptions. Patient is not currently taking opioid prescriptions. Functional  ability and status Nutritional status Physical activity Advanced directives List of other physicians Hospitalizations, surgeries, and ER visits in previous 12 months Vitals Screenings to include cognitive, depression, and falls Referrals and appointments  In addition, I have reviewed and discussed with patient certain preventive protocols, quality metrics, and best practice recommendations. A written personalized care plan for preventive services as well as general preventive health recommendations were provided to patient.     Pinky Bright, LPN   04/19/8117   After Visit Summary: (MyChart) Due to this being a telephonic visit, the after visit summary with patients personalized plan was offered to patient via MyChart   Notes: MAMMOGRAM ORDERED

## 2023-06-07 DIAGNOSIS — Z8673 Personal history of transient ischemic attack (TIA), and cerebral infarction without residual deficits: Secondary | ICD-10-CM | POA: Diagnosis not present

## 2023-06-07 DIAGNOSIS — E7800 Pure hypercholesterolemia, unspecified: Secondary | ICD-10-CM | POA: Diagnosis not present

## 2023-06-07 DIAGNOSIS — I10 Essential (primary) hypertension: Secondary | ICD-10-CM | POA: Diagnosis not present

## 2023-06-07 DIAGNOSIS — I4811 Longstanding persistent atrial fibrillation: Secondary | ICD-10-CM | POA: Diagnosis not present

## 2023-07-18 ENCOUNTER — Encounter: Payer: Self-pay | Admitting: Family Medicine

## 2023-07-18 ENCOUNTER — Ambulatory Visit (INDEPENDENT_AMBULATORY_CARE_PROVIDER_SITE_OTHER): Payer: Self-pay | Admitting: Family Medicine

## 2023-07-18 VITALS — BP 128/70 | HR 84 | Resp 16 | Ht 62.0 in | Wt 274.6 lb

## 2023-07-18 DIAGNOSIS — E559 Vitamin D deficiency, unspecified: Secondary | ICD-10-CM

## 2023-07-18 DIAGNOSIS — E782 Mixed hyperlipidemia: Secondary | ICD-10-CM

## 2023-07-18 DIAGNOSIS — N182 Chronic kidney disease, stage 2 (mild): Secondary | ICD-10-CM | POA: Diagnosis not present

## 2023-07-18 DIAGNOSIS — Z9884 Bariatric surgery status: Secondary | ICD-10-CM | POA: Diagnosis not present

## 2023-07-18 DIAGNOSIS — R739 Hyperglycemia, unspecified: Secondary | ICD-10-CM | POA: Diagnosis not present

## 2023-07-18 DIAGNOSIS — G43709 Chronic migraine without aura, not intractable, without status migrainosus: Secondary | ICD-10-CM

## 2023-07-18 DIAGNOSIS — D7589 Other specified diseases of blood and blood-forming organs: Secondary | ICD-10-CM

## 2023-07-18 DIAGNOSIS — Z0001 Encounter for general adult medical examination with abnormal findings: Secondary | ICD-10-CM

## 2023-07-18 DIAGNOSIS — I4811 Longstanding persistent atrial fibrillation: Secondary | ICD-10-CM | POA: Diagnosis not present

## 2023-07-18 DIAGNOSIS — Z0000 Encounter for general adult medical examination without abnormal findings: Secondary | ICD-10-CM

## 2023-07-18 MED ORDER — SUMATRIPTAN SUCCINATE 50 MG PO TABS: 50.0000 mg | ORAL_TABLET | ORAL | 0 refills | Status: DC | PRN

## 2023-07-18 NOTE — Progress Notes (Signed)
 Complete physical exam   Patient: Karen [name]   DOB: 1950-12-14   73 y.o. Female  MRN: [id number] Visit Date: 07/18/2023  Today's healthcare provider: LAURAINE LOISE BUOY, DO   Chief Complaint  Patient presents with   Annual Exam    Sleeping pattern: good Exercising: bicycle on feet, mild walking  No concerns   Subjective    Karen [name] is a 73 y.o. female who presents today for a complete physical exam.  She reports consuming a general diet, with minimal fried foods if any. She exercises uses a floor stationary bike and walks doing errands and cleaning her house. Exercise is limited by orthopedic condition(s): bilateral knee pain.   She generally feels fairly well. She reports sleeping well. She does not have additional problems to discuss today.   HPI HPI     Annual Exam    Additional comments: Sleeping pattern: good Exercising: bicycle on feet, mild walking  No concerns      Last edited by Wilfred Hargis RAMAN, CMA on 07/18/2023 10:03 AM.      Karen [name] is a 73 year old female with atrial fibrillation who presents for routine follow-up.  She is in chronic atrial fibrillation but remains asymptomatic. She visited her cardiologist last month and is scheduled to return in six months. Her blood pressure readings have been consistent with previous measurements. She is currently taking Eliquis twice daily.  She experiences chronic knee pain, likely due to osteoarthritis, and mentions needing cortisone injections, which she last received about a year ago. Despite the pain, she continues to do her own shopping and housework, indicating that the pain is bothersome but manageable.  Her diet consists mostly of baked and grilled foods, avoiding fried items. She and her husband share small portions, which she attributes to her past vertical banded gastroplasty, limiting her food intake. She engages in regular physical activity, including using a stationary bicycle and walking during  shopping trips.  She has a history of migraines, experiencing them daily, often triggered by bright lights. She previously used Imitrex , which was effective but caused stomach upset when switched to the generic version. Currently, she manages with Excedrin Migraine, taking one or two as needed.  She has a history of a stroke in her thirties, which initially caused speech difficulties but resulted in no long-term physical impairments. She remains vigilant about stroke symptoms due to her husband's health issues, including PAD and a past stroke.  She has a history of pancreatitis, which has led her to avoid alcohol. She also mentions having a cataract in her left eye that has been stable for ten years.  She experiences occasional numbness in her left arm and hand, which she attributes to a fall in February that resulted in bruising on her left side. The numbness occurs sporadically and resolves on its own.  She has allergies that developed after moving to the area from [place] , causing a persistent cough over the weekend.     Past Medical History:  Diagnosis Date   A-fib [facility])    Allergy 1990   Anemia    Arthritis 2015   Cataract 2017   Hyperlipidemia    Stroke (HCC) 1991   Vitamin D  deficiency    Past Surgical History:  Procedure Laterality Date   ABDOMINAL HYSTERECTOMY     CHOLECYSTECTOMY     CHOLECYSTECTOMY  01/11/1985   COLONOSCOPY WITH PROPOFOL  N/A 10/17/2019   Procedure: COLONOSCOPY WITH PROPOFOL ;  Surgeon: Dessa Purchase  W, MD;  Location: [facility] ENDOSCOPY;  Service: Endoscopy;  Laterality: N/A;   GASTROPLASTY  01/11/1985   HERNIA REPAIR     Social History   Socioeconomic History   Marital status: Married    Spouse name: Not on file   Number of children: 0   Years of education: Not on file   Highest education level: 12th grade  Occupational History   Occupation: retired  Tobacco Use   Smoking status: Never   Smokeless tobacco: Never  Vaping Use   Vaping status:  Never Used  Substance and Sexual Activity   Alcohol use: Not Currently    Comment: rarely Liqueur   Drug use: Never   Sexual activity: Not Currently    Birth control/protection: Post-menopausal  Other Topics Concern   Not on file  Social History Narrative   Not on file   Social Drivers of Health   Financial Resource Strain: Low Risk  (07/14/2023)   Overall Financial Resource Strain (CARDIA)    Difficulty of Paying Living Expenses: Not very hard  Food Insecurity: No Food Insecurity (07/14/2023)   Hunger Vital Sign    Worried About Running Out of Food in the Last Year: Never true    Ran Out of Food in the Last Year: Never true  Transportation Needs: No Transportation Needs (07/14/2023)   PRAPARE - Administrator, Civil Service (Medical): No    Lack of Transportation (Non-Medical): No  Physical Activity: Insufficiently Active (07/14/2023)   Exercise Vital Sign    Days of Exercise per Week: 3 days    Minutes of Exercise per Session: 40 min  Stress: No Stress Concern Present (07/14/2023)   [employer] of Occupational Health - Occupational Stress Questionnaire    Feeling of Stress: Not at all  Social Connections: Moderately Integrated (07/14/2023)   Social Connection and Isolation Panel    Frequency of Communication with Friends and Family: More than three times a week    Frequency of Social Gatherings with Friends and Family: Once a week    Attends Religious Services: Never    Database administrator or Organizations: Yes    Attends Banker Meetings: 1 to 4 times per year    Marital Status: Married  Catering manager Violence: Not At Risk (05/11/2023)   Humiliation, Afraid, Rape, and Kick questionnaire    Fear of Current or Ex-Partner: No    Emotionally Abused: No    Physically Abused: No    Sexually Abused: No   Family Status  Relation Name Status   Mother Gabriel Devonshire Deceased   Father Laurier Deceased   Sister Inocente Devonshire [facility] Deceased   Brother   Deceased   PGF Marsa Devonshire Deceased   Brother Jerel Devonshire Alive   MGM Therisa Endo (Not Specified)   Mat Aunt Mazie Piety (Not Specified)   MGF Sim Endo (Not Specified)   PGM Clara Devonshire (Not Specified)   Mat Aunt Mardeen Piety (Not Specified)   Brother Sharolyn Devonshire (Not Specified)  No partnership data on file   Family History  Problem Relation Age of Onset   Hypertension Mother    Cancer Mother    Arthritis Mother    Depression Mother    Heart disease Mother    Obesity Mother    Stroke Mother    Heart disease Father    Breast cancer Sister    Cancer Sister    Pancreatic cancer Brother    Cancer Paternal Grandfather  Hyperlipidemia Brother    Hypertension Brother    Cancer Brother    Heart disease Maternal Grandmother    Stroke Maternal Grandmother    Arthritis Maternal Grandmother    Breast cancer Maternal Aunt    Cancer Maternal Aunt    Heart disease Maternal Grandfather    Heart disease Paternal Grandmother    Arthritis Maternal Aunt    Arthritis Brother    Hearing loss Brother    Heart disease Brother    Hypertension Brother    Allergies  Allergen Reactions   Tetanus Toxoid     Multiple joint pains.    Patient Care Team: Donzella Lauraine SAILOR, DO as PCP - General (Family Medicine) Broadus Bare, OD (Optometry) Dessa, Reyes ORN, MD as Consulting Physician (General Surgery) Josue Oneil ORN, DDS as Referring Physician (Dentistry) Ammon Blunt, MD as Consulting Physician (Cardiology)   Medications: Outpatient Medications Prior to Visit  Medication Sig   apixaban (ELIQUIS) 5 MG TABS tablet Take 5 mg by mouth 2 (two) times daily.   aspirin-acetaminophen -caffeine (EXCEDRIN MIGRAINE) 250-250-65 MG tablet Take by mouth as needed for headache.   atorvastatin  (LIPITOR) 40 MG tablet TAKE 1 TABLET BY MOUTH EVERY DAY   b complex vitamins capsule Take 1 capsule by mouth daily.    Calcium  Carbonate (CALCIUM  600 PO) Take by mouth daily.    cholecalciferol (VITAMIN D ) 1000 UNITS tablet Take 1,000 Units by mouth daily.    diltiazem (CARDIZEM CD) 120 MG 24 hr capsule Take by mouth.   MULTIPLE VITAMIN PO Take by mouth daily.   Omega-3 Fatty Acids (FISH OIL) 1000 MG CAPS Take by mouth daily.    No facility-administered medications prior to visit.    Review of Systems  Constitutional:  Negative for chills, fatigue and fever.  HENT:  Negative for congestion, ear pain, rhinorrhea, sneezing and sore throat.   Eyes: Negative.  Negative for pain and redness.  Respiratory:  Negative for cough, shortness of breath and wheezing.   Cardiovascular:  Negative for chest pain and leg swelling.  Gastrointestinal:  Negative for abdominal pain, blood in stool, constipation, diarrhea and nausea.  Endocrine: Negative for polydipsia and polyphagia.  Genitourinary: Negative.  Negative for dysuria, flank pain, hematuria, pelvic pain, vaginal bleeding and vaginal discharge.  Musculoskeletal:  Negative for arthralgias, back pain, gait problem and joint swelling.  Skin:  Negative for rash.  Neurological: Negative.  Negative for dizziness, tremors, seizures, weakness, light-headedness, numbness and headaches.  Hematological:  Negative for adenopathy.  Psychiatric/Behavioral: Negative.  Negative for behavioral problems, confusion and dysphoric mood. The patient is not nervous/anxious and is not hyperactive.       Objective    BP 128/70 (BP Location: Left Arm, Patient Position: Sitting, Cuff Size: Large)   Pulse 84   Resp 16   Ht 5' 2 (1.575 m)   Wt 274 lb 9.6 oz (124.6 kg)   SpO2 98%   BMI 50.22 kg/m    Physical Exam Vitals and nursing note reviewed.  Constitutional:      General: She is awake.     Appearance: Normal appearance. She is morbidly obese.  HENT:     Head: Normocephalic and atraumatic.     Right Ear: Tympanic membrane, ear canal and external ear normal.     Left Ear: Tympanic membrane, ear canal and external ear normal.      Nose: Nose normal.     Mouth/Throat:     Mouth: Mucous membranes are moist.     Pharynx:  Oropharynx is clear. No oropharyngeal exudate or posterior oropharyngeal erythema.  Eyes:     General: No scleral icterus.    Extraocular Movements: Extraocular movements intact.     Conjunctiva/sclera: Conjunctivae normal.     Pupils: Pupils are equal, round, and reactive to light.  Neck:     Thyroid : No thyromegaly or thyroid  tenderness.  Cardiovascular:     Rate and Rhythm: Normal rate and regular rhythm.     Pulses: Normal pulses.     Heart sounds: Normal heart sounds.  Pulmonary:     Effort: Pulmonary effort is normal. No tachypnea, bradypnea or respiratory distress.     Breath sounds: Normal breath sounds. No stridor. No wheezing, rhonchi or rales.  Abdominal:     General: Bowel sounds are normal. There is no distension.     Palpations: Abdomen is soft. There is no mass.     Tenderness: There is no abdominal tenderness. There is no guarding.     Hernia: No hernia is present.  Musculoskeletal:     Cervical back: Normal range of motion and neck supple.     Right lower leg: No edema.     Left lower leg: No edema.  Lymphadenopathy:     Cervical: No cervical adenopathy.  Skin:    General: Skin is warm and dry.  Neurological:     Mental Status: She is alert and oriented to person, place, and time. Mental status is at baseline.  Psychiatric:        Mood and Affect: Mood normal.        Behavior: Behavior normal.      Last depression screening scores    07/18/2023   10:05 AM 05/11/2023    9:38 AM 01/18/2023   10:32 AM  PHQ 2/9 Scores  PHQ - 2 Score 0 0 0  PHQ- 9 Score 1 0 0   Last fall risk screening    05/11/2023    9:40 AM  Fall Risk   Falls in the past year? 1  Number falls in past yr: 0  Injury with Fall? 0  Follow up Falls evaluation completed;Falls prevention discussed   Last Audit-C alcohol use screening    07/14/2023   11:51 AM  Alcohol Use Disorder Test (AUDIT)  1.  How often do you have a drink containing alcohol? 0  3. How often do you have six or more drinks on one occasion? 0   A score of 3 or more in women, and 4 or more in men indicates increased risk for alcohol abuse, EXCEPT if all of the points are from question 1   No results found for any visits on 07/18/23.  Assessment & Plan    Routine Health Maintenance and Physical Exam  Exercise Activities and Dietary recommendations  Goals      DIET - EAT MORE FRUITS AND VEGETABLES     DIET - INCREASE WATER INTAKE     Exercise 3x per week (30 min per time)     Recommend to exercise for 3 days a week for at least 30 minutes at a time.         Immunization History  Administered Date(s) Administered   Fluad Quad(high Dose 65+) 09/28/2018, 12/26/2019, 11/05/2020, 11/05/2021   Influenza Split 11/08/2008, 02/23/2011, 11/23/2011   Influenza, High Dose Seasonal PF 11/03/2017   Influenza,inj,Quad PF,6+ Mos 11/08/2013, 10/24/2014, 10/15/2016   Influenza-Unspecified 11/08/2013   Moderna Sars-Covid-2 Vaccination 03/24/2019, 04/21/2019   PFIZER(Purple Top)SARS-COV-2 Vaccination 12/26/2019   Pfizer Covid-19  Vaccine Bivalent Booster 22yrs & up 11/05/2021   Pneumococcal Conjugate-13 05/10/2017, 11/03/2017   Pneumococcal Polysaccharide-23 07/06/2019   Zoster, Live 03/20/2015    Health Maintenance  Topic Date Due   Zoster Vaccines- Shingrix (1 of 2) 06/20/1971   COVID-19 Vaccine (5 - 2024-25 season) 09/12/2023 (Originally 09/12/2022)   MAMMOGRAM  07/29/2023   INFLUENZA VACCINE  08/12/2023   Medicare Annual Wellness (AWV)  05/10/2024   Fecal DNA (Cologuard)  08/22/2025   DEXA SCAN  09/18/2026   Pneumococcal Vaccine: 50+ Years  Completed   Hepatitis C Screening  Completed   Hepatitis B Vaccines  Aged Out   HPV VACCINES  Aged Out   Meningococcal B Vaccine  Aged Out   DTaP/Tdap/Td  Discontinued   Colonoscopy  Discontinued    Discussed health benefits of physical activity, and encouraged her to  engage in regular exercise appropriate for her age and condition.   Annual physical exam  Bariatric surgery status -     Zinc  -     Copper , serum -     Magnesium  Chronic kidney disease, stage 2, mildly decreased GFR -     Microalbumin / creatinine urine ratio -     Comprehensive metabolic panel with GFR  Macrocytosis without anemia -     Vitamin B12 -     CBC  Vitamin D  deficiency -     VITAMIN D  25 Hydroxy (Vit-D Deficiency, Fractures)  Hyperglycemia -     Hemoglobin A1c  Mixed hyperlipidemia -     Lipid panel  Chronic migraine without aura without status migrainosus, not intractable -     Magnesium -     SUMAtriptan  Succinate; Take 1 tablet (50 mg total) by mouth every 2 (two) hours as needed for migraine. May repeat in 2 hours if headache persists or recurs.  Dispense: 10 tablet; Refill: 0  Longstanding persistent atrial fibrillation (HCC)     Annual physical exam Physical exam overall unremarkable except as noted above. Routine lab work ordered as noted. Has not received updated shingles or pneumonia vaccines. Discussed importance due to age and history of bronchitis and walking pneumonia. - Encourage receiving the updated shingles vaccine series. - Consider updating pneumonia vaccination with Prevnar 20 or 21.  Chronic bilateral knee Pain Chronic knee pain likely due to osteoarthritis. Last cortisone injection was a year ago. - Schedule cortisone injections for pain management.  Migraine Headaches Chronic migraines with daily headaches. Light sensitivity is a trigger. Previous sumatriptan  use caused gastrointestinal upset. Discussed retrying sumatriptan  due to cost and past effectiveness. - Prescribe sumatriptan  50 mg with instructions to halve if needed. - Advise to take one dose and repeat after two hours if necessary.  Longstanding persistent Atrial Fibrillation Chronic asymptomatic atrial fibrillation. On Eliquis for anticoagulation. Blood pressure  well-managed. Cardiologist follow-up in six months. - Continue Eliquis twice daily and diltiazem CD 120 mg daily. - Follow up with cardiologist in six months.    Follow-up Due for routine blood work and upcoming mammogram. Additional labs for zinc , copper , and magnesium due to bariatric surgery. - Order blood work including zinc , copper , and magnesium levels. - Keep scheduled mammogram appointment.  Return in about 6 months (around 01/18/2024) for Chronic f/u.     I discussed the assessment and treatment plan with the patient  The patient was provided an opportunity to ask questions and all were answered. The patient agreed with the plan and demonstrated an understanding of the instructions.   The patient was advised  to call back or seek an in-person evaluation if the symptoms worsen or if the condition fails to improve as anticipated.    LAURAINE LOISE BUOY, DO  [facility] [place] [phone] (phone) [phone] (fax)  [facility]

## 2023-07-18 NOTE — Patient Instructions (Addendum)
 Recommended vaccine: Shingrix (shingles) and Prevnar-20 or -21 (pneumonia).

## 2023-07-20 LAB — CBC
Hematocrit: 42.1 % (ref 34.0–46.6)
Hemoglobin: 13.2 g/dL (ref 11.1–15.9)
MCH: 33.1 pg — ABNORMAL HIGH (ref 26.6–33.0)
MCHC: 31.4 g/dL — ABNORMAL LOW (ref 31.5–35.7)
MCV: 106 fL — ABNORMAL HIGH (ref 79–97)
Platelets: 194 x10E3/uL (ref 150–450)
RBC: 3.99 x10E6/uL (ref 3.77–5.28)
RDW: 11.6 % — ABNORMAL LOW (ref 11.7–15.4)
WBC: 5.9 x10E3/uL (ref 3.4–10.8)

## 2023-07-20 LAB — LIPID PANEL
Chol/HDL Ratio: 2.8 ratio (ref 0.0–4.4)
Cholesterol, Total: 143 mg/dL (ref 100–199)
HDL: 52 mg/dL (ref >39)
LDL Chol Calc (NIH): 70 mg/dL (ref 0–99)
Triglycerides: 116 mg/dL (ref 0–149)
VLDL Cholesterol Cal: 21 mg/dL (ref 5–40)

## 2023-07-20 LAB — COMPREHENSIVE METABOLIC PANEL WITH GFR
ALT: 16 IU/L (ref 0–32)
AST: 24 IU/L (ref 0–40)
Albumin: 4.1 g/dL (ref 3.8–4.8)
Alkaline Phosphatase: 81 IU/L (ref 44–121)
BUN/Creatinine Ratio: 13 (ref 12–28)
BUN: 13 mg/dL (ref 8–27)
Bilirubin Total: 1 mg/dL (ref 0.0–1.2)
CO2: 21 mmol/L (ref 20–29)
Calcium: 10 mg/dL (ref 8.7–10.3)
Chloride: 104 mmol/L (ref 96–106)
Creatinine, Ser: 1.03 mg/dL — ABNORMAL HIGH (ref 0.57–1.00)
Globulin, Total: 2.7 g/dL (ref 1.5–4.5)
Glucose: 116 mg/dL — ABNORMAL HIGH (ref 70–99)
Potassium: 4.3 mmol/L (ref 3.5–5.2)
Sodium: 143 mmol/L (ref 134–144)
Total Protein: 6.8 g/dL (ref 6.0–8.5)
eGFR: 58 mL/min/1.73 — ABNORMAL LOW (ref >59)

## 2023-07-20 LAB — MICROALBUMIN / CREATININE URINE RATIO
Creatinine, Urine: 350.4 mg/dL
Microalb/Creat Ratio: 11 mg/g{creat} (ref 0–29)
Microalbumin, Urine: 39.2 ug/mL

## 2023-07-20 LAB — VITAMIN D 25 HYDROXY (VIT D DEFICIENCY, FRACTURES): Vit D, 25-Hydroxy: 37.7 ng/mL (ref 30.0–100.0)

## 2023-07-20 LAB — HEMOGLOBIN A1C
Est. average glucose Bld gHb Est-mCnc: 97 mg/dL
Hgb A1c MFr Bld: 5 % (ref 4.8–5.6)

## 2023-07-20 LAB — ZINC: Zinc: 81 ug/dL (ref 44–115)

## 2023-07-20 LAB — MAGNESIUM: Magnesium: 2 mg/dL (ref 1.6–2.3)

## 2023-07-20 LAB — COPPER, SERUM: Copper: 110 ug/dL (ref 80–158)

## 2023-07-20 LAB — VITAMIN B12: Vitamin B-12: 1062 pg/mL (ref 232–1245)

## 2023-07-25 ENCOUNTER — Ambulatory Visit: Payer: Self-pay | Admitting: Family Medicine

## 2023-07-25 DIAGNOSIS — R944 Abnormal results of kidney function studies: Secondary | ICD-10-CM

## 2023-08-02 ENCOUNTER — Ambulatory Visit
Admission: RE | Admit: 2023-08-02 | Discharge: 2023-08-02 | Disposition: A | Discharge status: Home / Self Care | Source: Ambulatory Visit | Attending: Family Medicine | Admitting: Family Medicine

## 2023-08-02 DIAGNOSIS — Z1231 Encounter for screening mammogram for malignant neoplasm of breast: Secondary | ICD-10-CM | POA: Insufficient documentation

## 2023-08-05 DIAGNOSIS — M170 Bilateral primary osteoarthritis of knee: Secondary | ICD-10-CM | POA: Diagnosis not present

## 2023-08-08 ENCOUNTER — Ambulatory Visit: Payer: Self-pay | Admitting: Family Medicine

## 2023-08-13 ENCOUNTER — Other Ambulatory Visit: Payer: Self-pay | Admitting: Family Medicine

## 2023-08-13 DIAGNOSIS — G43709 Chronic migraine without aura, not intractable, without status migrainosus: Secondary | ICD-10-CM

## 2023-08-17 ENCOUNTER — Telehealth: Payer: Self-pay

## 2023-08-17 NOTE — Telephone Encounter (Signed)
 Copied from CRM #[id number]. Topic: Clinical - Request for Lab/Test Order >> Aug 17, 2023  3:47 PM Donna BRAVO wrote: Reason for CRM: patient calling asking for labs to be ordered. Patient has MyChart and will check tomorrow morning

## 2023-08-18 DIAGNOSIS — R944 Abnormal results of kidney function studies: Secondary | ICD-10-CM | POA: Diagnosis not present

## 2023-08-19 LAB — BASIC METABOLIC PANEL WITH GFR
BUN/Creatinine Ratio: 12 (ref 12–28)
BUN: 10 mg/dL (ref 8–27)
CO2: 21 mmol/L (ref 20–29)
Calcium: 9.7 mg/dL (ref 8.7–10.3)
Chloride: 105 mmol/L (ref 96–106)
Creatinine, Ser: 0.86 mg/dL (ref 0.57–1.00)
Glucose: 110 mg/dL — ABNORMAL HIGH (ref 70–99)
Potassium: 4.1 mmol/L (ref 3.5–5.2)
Sodium: 142 mmol/L (ref 134–144)
eGFR: 72 mL/min/{1.73_m2}

## 2023-10-04 ENCOUNTER — Ambulatory Visit: Payer: Self-pay | Admitting: *Deleted

## 2023-10-04 NOTE — Telephone Encounter (Signed)
 Copied from CRM [id number]. Topic: Clinical - Red Word Triage >> Oct 04, 2023  9:08 AM Carlatta H wrote: [facility] that prompted transfer to Nurse Triage: Patient is having extreme pain in right knee// Reason for Disposition  [1] SEVERE pain (e.g., excruciating, unable to walk) AND [2] not improved after 2 hours of pain medicine  Answer Assessment - Initial Assessment Questions 1. LOCATION and RADIATION: Where is the pain located?      I'm having a lot of pain in my right knee and down my leg.   I take Eliquis.   I have Oxycodone 10-325 mg.   Can I take one of these with the Eliquis.   I need it for the knee pain.   2. QUALITY: What does the pain feel like?  (e.g., sharp, dull, aching, burning)     It's hurting so bad.   It started last night.   I got up from the couch and it started hurting suddenly.   I've gotten cortisone shots in my knees.    3. SEVERITY: How bad is the pain? What does it keep you from doing?   (Scale 1-10; or mild, moderate, severe)     Severe   I can't make it into the office. 4. ONSET: When did the pain start? Does it come and go, or is it there all the time?     Last night getting up from the couch.    5. RECURRENT: Have you had this pain before? If Yes, ask: When, and what happened then?     Not this bad.   I have arthritis and bone on bone.   I see Dr Cleotilde at [facility] for [facility].   No surgery yet due to my weight. 6. SETTING: Has there been any recent work, exercise or other activity that involved that part of the body?      I got up from the cough last night. 7. AGGRAVATING FACTORS: What makes the knee pain worse? (e.g., walking, climbing stairs, running)     I can't hardly walk this morning.  8. ASSOCIATED SYMPTOMS: Is there any swelling or redness of the knee?     It's a little swollen.   I have redness but I have medicated patches on my knee so it could be from the patches.    When I try to lift my leg it hurts so bad.   9. OTHER  SYMPTOMS: Do you have any other symptoms? (e.g., calf pain, chest pain, difficulty breathing, fever)     Not aksed 10. PREGNANCY: Is there any chance you are pregnant? When was your last menstrual period?       N/A due to age  Protocols used: Knee Pain-A-AH Wanting to know if she can take Oxycodone with Tylenol  in it 10-325 mg with being on Eliquis.   If not would Dr. Donzella suggest something she can take for the knee pain or call something in for her she can take with the Eliquis?      FYI Only or Action Required?: Action required by provider: clinical question for provider.  Patient was last seen in primary care on 07/18/2023 by Donzella Lauraine SAILOR, DO.  Called Nurse Triage reporting Knee Pain.  Symptoms began yesterday.Last night getting up from couch got a sudden pain in right knee.  This morning can't hardly walk.   Unable to come in today.   She is on Eliquis.  Wanting to know if she can take oxycodone with Tylenol   in it 10-325 mg since she is on Eliquis.  It's some she has left over.   Tylenol  not helping.   Can't take NSAIDs with Eliquis.  Please call at [phone].   If not answer please call back.  Not able to get to phone fast so please call back.    Interventions attempted: Nothing.  Symptoms are: rapidly worsening.  Triage Disposition: See Physician Within 24 HoursAppt made for 10/05/2023 with Dr. Donzella.  Hopefully she can make it in.  Patient/caregiver understands and will follow disposition?: Yes

## 2023-10-05 ENCOUNTER — Ambulatory Visit: Admitting: Family Medicine

## 2023-10-05 NOTE — Telephone Encounter (Signed)
 Patient aware of provider's recommendation. While on the phone patient asked if able to take Meloxicam  Per Dr. Donzella not to take Meloxicam  or any NSAIDs while on Eliquis.  Patient verbalized understanding.

## 2023-10-06 ENCOUNTER — Ambulatory Visit: Admitting: Family Medicine

## 2023-10-10 DIAGNOSIS — M170 Bilateral primary osteoarthritis of knee: Secondary | ICD-10-CM | POA: Diagnosis not present

## 2023-10-10 DIAGNOSIS — M25561 Pain in right knee: Secondary | ICD-10-CM | POA: Diagnosis not present

## 2023-10-12 ENCOUNTER — Ambulatory Visit: Payer: Self-pay

## 2023-10-12 ENCOUNTER — Ambulatory Visit (INDEPENDENT_AMBULATORY_CARE_PROVIDER_SITE_OTHER): Admitting: Family Medicine

## 2023-10-12 ENCOUNTER — Encounter: Payer: Self-pay | Admitting: Family Medicine

## 2023-10-12 VITALS — BP 142/76 | HR 100 | Ht 62.0 in | Wt 274.6 lb

## 2023-10-12 DIAGNOSIS — Z23 Encounter for immunization: Secondary | ICD-10-CM | POA: Diagnosis not present

## 2023-10-12 DIAGNOSIS — R310 Gross hematuria: Secondary | ICD-10-CM

## 2023-10-12 DIAGNOSIS — I480 Paroxysmal atrial fibrillation: Secondary | ICD-10-CM

## 2023-10-12 NOTE — Progress Notes (Signed)
 Acute Office Visit  Introduced to nurse practitioner role and practice setting.  All questions answered.  Discussed provider/patient relationship and expectations.   Subjective:     Patient ID: Karen [name], female    DOB: 1950-01-18, 73 y.o.   MRN: [id number]  Chief Complaint  Patient presents with   Acute Visit    Blood in urine RN Triage today (10/12/23) Patient reported blood in urine X 1 day (late afternoon). No pain, no fever, no associated symptoms. At the last time she last actively saw blood in urine at 5 am and again around 6:10 am, again at 9 am. Patient is currently on Eliquis and reports only seeing the blood in urine. No active bleeding when wiping.    Hematuria    Discussed the use of AI scribe software for clinical note transcription with the patient, who gave verbal consent to proceed.  History of Present Illness Karen [name] is a 73 year old female with atrial fibrillation who presents with blood in her urine.  She noticed hematuria end of  last week, which initially resolved but recurred yesterday. The urine has a tinge of red without any blood clots. The blood is no longer visible on toilet paper or clothing, but she noticed it on a white bath mat. No vaginal bleeding is present. Denies fevers, chills, pelvic or back pain.  She has a history of atrial fibrillation and is on Eliquis. Recently, she took meloxicam  for knee pain for six days and oxycodone for a few days following advice from a pharmacist last week after injury from dog. She is concerned that the combination of medications may have contributed to the hematuria.  Knee pain- She received a cortisone shot on Monday, 10/10/23  She reports difficulty maintaining hygiene due to knee pain, which has limited her ability to shower. She reports occasional discomfort but no significant pain. She has a history of a kidney stone but describes the current symptoms as different.  No other bleeding symptoms such as  bruising, hemoptysis, or melena. No significant abdominal pain, though she did have a brief pain that resolved quickly.   HPI  ROS      Objective:    BP (!) 142/76 (BP Location: Left Arm, Patient Position: Sitting, Cuff Size: Normal)   Pulse 100   Ht 5' 2 (1.575 m)   Wt 274 lb 9.6 oz (124.6 kg)   SpO2 97%   BMI 50.22 kg/m    Physical Exam Constitutional:      General: She is not in acute distress.    Appearance: Normal appearance. She is obese. She is not toxic-appearing or diaphoretic.  HENT:     Head: Normocephalic.     Nose: Nose normal.     Mouth/Throat:     Mouth: Mucous membranes are moist.     Pharynx: Oropharynx is clear.  Eyes:     Extraocular Movements: Extraocular movements intact.     Pupils: Pupils are equal, round, and reactive to light.  Cardiovascular:     Rate and Rhythm: Normal rate and regular rhythm.     Pulses: Normal pulses.     Heart sounds: Normal heart sounds. No murmur heard.    No friction rub. No gallop.  Pulmonary:     Effort: No respiratory distress.     Breath sounds: No stridor. No wheezing, rhonchi or rales.  Chest:     Chest wall: No tenderness.  Abdominal:     General: Abdomen is protuberant. There  is no distension.     Palpations: Abdomen is soft. There is no mass.     Tenderness: There is no abdominal tenderness. There is no right CVA tenderness, left CVA tenderness, guarding or rebound.     Hernia: No hernia is present.  Musculoskeletal:     Right lower leg: Edema present.     Left lower leg: Edema present.  Skin:    General: Skin is warm and dry.     Capillary Refill: Capillary refill takes less than 2 seconds.  Neurological:     General: No focal deficit present.     Mental Status: She is alert and oriented to person, place, and time. Mental status is at baseline.     Cranial Nerves: No cranial nerve deficit.  Psychiatric:        Mood and Affect: Mood normal.        Behavior: Behavior normal.        Thought Content:  Thought content normal.        Judgment: Judgment normal.     No results found for any visits on 10/12/23.      Assessment & Plan:  Assessment and Plan Assessment & Plan Hematuria Intermittent hematuria with pinkish to reddish urine, no blood clots. Possible urinary tract infection due to recent immobility and inadequate hygiene. Differential includes urinary tract infection and potential side effects from medications, including Eliquis and NSAIDs. - Denies fevers, chills, back pain, pelvic pain, dysuria, odor - Ordered urinalysis and urine culture - Ordered CBC to check blood counts - Advised to avoid NSAIDs - may take tylenol  - Will contact cardiologist, Dr.Paraschos, regarding Eliquis use/dose - May need further imaging if continues or referral to urology - Will communicate results, if positive urine culture - will write for ABX.  Atrial fibrillation Chronic atrial fibrillation managed with Eliquis. No recent episodes of uncontrolled AFib. Concerns about potential bleeding due to medication interactions. Managed by cards. - on diltiazem - Continue Eliquis - Will contact cardiologist regarding Eliquis use  Encounter for immunization - Administered flu vaccine   Problem List Items Addressed This Visit   None Visit Diagnoses       Gross hematuria    -  Primary   Relevant Orders   CBC   Urinalysis, Routine w reflex microscopic   Urine Culture     Paroxysmal atrial fibrillation (HCC)         Immunization due       Relevant Orders   Flu vaccine HIGH DOSE PF(Fluzone Trivalent) (Completed)       No orders of the defined types were placed in this encounter.   No follow-ups on file.  Karen DELENA Boom, FNP  I, Karen DELENA Boom, FNP, have reviewed all documentation for this visit. The documentation on 10/12/23 for the exam, diagnosis, procedures, and orders are all accurate and complete.

## 2023-10-12 NOTE — Telephone Encounter (Addendum)
 FYI Only or Action Required?: FYI only for provider.  Patient was last seen in primary care on 07/18/2023 by Donzella Lauraine SAILOR, DO.  Called Nurse Triage reporting Hematuria.  Symptoms began yesterday.  Interventions attempted: Nothing.  Symptoms are: unchanged.  Triage Disposition: See HCP Within 4 Hours (Or PCP Triage)  Patient/caregiver understands and will follow disposition?: Yes  **Appt. Scheduled for 10/1, patient also requested a wheelchair when she arrives**       Copied from CRM #[id number]. Topic: Clinical - Red Word Triage >> Oct 12, 2023  8:48 AM Tiffini S wrote: [facility] that prompted transfer to Nurse Triage: Patient is bleeding when using the restroom- started again last night and twice this morning Reason for Disposition  Taking Coumadin (warfarin) or other strong blood thinner, or known bleeding disorder (e.g., thrombocytopenia)  Answer Assessment - Initial Assessment Questions 1. COLOR of URINE: Describe the color of the urine.  (e.g., tea-colored, pink, red, bloody) Do you have blood clots in your urine? (e.g., none, pea, grape, small coin)     Red  2. ONSET: When did the bleeding start?      X 1 day, late afternoon   3. EPISODES: How many times has there been blood in the urine? or How many times today?     6 am this morning   4. PAIN with URINATION: Is there any pain with passing your urine? If Yes, ask: How bad is the pain?  (Scale 1-10; or mild, moderate, severe)     No   5. FEVER: Do you have a fever? If Yes, ask: What is your temperature, how was it measured, and when did it start?     No   6. ASSOCIATED SYMPTOMS: Are you passing urine more frequently than usual?     No   7. OTHER SYMPTOMS: Do you have any other symptoms? (e.g., back/flank pain, abdomen pain, vomiting) No   Patient is on Elaquis currently. Blood only in urine, not actively bleeding, Appt. Scheduled for 10/1  Protocols used: Urine - Blood In-A-AH

## 2023-10-13 ENCOUNTER — Other Ambulatory Visit: Payer: Self-pay | Admitting: Family Medicine

## 2023-10-13 ENCOUNTER — Ambulatory Visit: Payer: Self-pay

## 2023-10-13 ENCOUNTER — Ambulatory Visit: Payer: Self-pay | Admitting: Family Medicine

## 2023-10-13 DIAGNOSIS — R310 Gross hematuria: Secondary | ICD-10-CM | POA: Diagnosis not present

## 2023-10-13 DIAGNOSIS — G43709 Chronic migraine without aura, not intractable, without status migrainosus: Secondary | ICD-10-CM

## 2023-10-13 LAB — CBC
Hematocrit: 39.3 % (ref 34.0–46.6)
Hemoglobin: 12.4 g/dL (ref 11.1–15.9)
MCH: 33 pg (ref 26.6–33.0)
MCHC: 31.6 g/dL (ref 31.5–35.7)
MCV: 105 fL — ABNORMAL HIGH (ref 79–97)
Platelets: 286 x10E3/uL (ref 150–450)
RBC: 3.76 x10E6/uL — ABNORMAL LOW (ref 3.77–5.28)
RDW: 11.6 % — ABNORMAL LOW (ref 11.7–15.4)
WBC: 6.9 x10E3/uL (ref 3.4–10.8)

## 2023-10-13 NOTE — Telephone Encounter (Signed)
 FYI Only or Action Required?: Action required by provider: clinical question for provider.  Patient was last seen in primary care on 10/12/2023 by Wellington Curtis LABOR, FNP.  Called Nurse Triage reporting Advice Only.  Symptoms began today.  Triage Disposition: Call PCP When Office is Open  Patient/caregiver understands and will follow disposition?: No, wishes to speak with PCP    Copied from CRM [id number]. Topic: Clinical - Red Word Triage >> Oct 13, 2023  1:09 PM Tobias L wrote: Red Word that prompted transfer to Nurse Triage: severe pain in side, inquiring what she can take OTC Reason for Disposition  [1] Caller requesting NON-URGENT health information AND [2] PCP's office is the best resource    Routing information to PCP for review  Answer Assessment - Initial Assessment Questions 1. REASON FOR CALL: What is the main reason for your call? or How can I best help you?   Pt called in today after visit yesterday for possible urinary issues: informed pt that lab results has not back yet. Pt stated she would like to order some Stone breaker medication from [employer] to see if that helps - pt would like to know if this will help her or not.  Pt would like recommendations for pain relief OTC or rx.  Please call pt or either send message through mychart  If pt does not answer: wait 5 minutes and call back due to pt using a walker to ambulate.  Protocols used: Information Only Call - No Triage-A-AH

## 2023-10-14 ENCOUNTER — Telehealth: Payer: Self-pay

## 2023-10-14 LAB — URINALYSIS, ROUTINE W REFLEX MICROSCOPIC
Bilirubin, UA: NEGATIVE
Glucose, UA: NEGATIVE
Ketones, UA: NEGATIVE
Nitrite, UA: NEGATIVE
Specific Gravity, UA: 1.026 (ref 1.005–1.030)
Urobilinogen, Ur: 0.2 mg/dL (ref 0.2–1.0)
pH, UA: 6 (ref 5.0–7.5)

## 2023-10-14 LAB — MICROSCOPIC EXAMINATION
Bacteria, UA: NONE SEEN
Casts: NONE SEEN /LPF
RBC, Urine: >30 /HPF — AB (ref 0–2)
WBC, UA: NONE SEEN /HPF (ref 0–5)

## 2023-10-14 LAB — SPECIMEN STATUS REPORT

## 2023-10-14 LAB — URINE CULTURE

## 2023-10-14 NOTE — Telephone Encounter (Signed)
 Pt called in stating She is hurting really bad from the UTI she has and wants to speak to a nurse about her results and the pain she is in. She said she can't go all weekend like this.   Spoke with pt an stated she is seeing her results through her MyChart and why a medication was not sent since she has not been feeling well with some pain. I advised pt we have not received her urine culture results and until finalized and received from lab once reviewed by provider it would be determine of what medication pt may need. Pt verbalized understanding. Told her we would call her with any changes if needed.

## 2023-11-29 DIAGNOSIS — E7800 Pure hypercholesterolemia, unspecified: Secondary | ICD-10-CM | POA: Diagnosis not present

## 2023-11-29 DIAGNOSIS — I10 Essential (primary) hypertension: Secondary | ICD-10-CM | POA: Diagnosis not present

## 2023-11-29 DIAGNOSIS — I4811 Longstanding persistent atrial fibrillation: Secondary | ICD-10-CM | POA: Diagnosis not present

## 2023-12-14 ENCOUNTER — Other Ambulatory Visit: Payer: Self-pay | Admitting: Family Medicine

## 2023-12-14 DIAGNOSIS — G43709 Chronic migraine without aura, not intractable, without status migrainosus: Secondary | ICD-10-CM

## 2023-12-14 NOTE — Telephone Encounter (Signed)
 Please review in provider's absence.  Thank you.   LOV- 10/12/2023 NOV- 01/19/2024 LRF- 10/15/2023 Outpatient Medication Detail   Disp Refills Start End   SUMAtriptan  (IMITREX ) 50 MG tablet 9 tablet 1 10/15/2023 --   Sig: TAKE 1 TABLET EVERY 2 HOURS AS NEEDED FOR MIGRAINE. MAY REPEAT IN 2 HRS IF HEADACHE PERSISTS/RECURS.   Sent to pharmacy as: SUMAtriptan  (IMITREX ) 50 MG tablet   E-Prescribing Status: Receipt confirmed by pharmacy (10/15/2023 12:36 AM EDT)

## 2024-01-19 ENCOUNTER — Ambulatory Visit: Admitting: Family Medicine

## 2024-01-19 NOTE — Progress Notes (Signed)
 Karen [name]                                          MRN: [id number]   01/19/2024   The [facility] Quality Team Specialist reviewed this patient medical record for the purposes of chart review for care gap closure. The following were reviewed: chart review for care gap closure-controlling blood pressure.    [facility] Quality Team

## 2024-05-16 ENCOUNTER — Ambulatory Visit
